# Patient Record
Sex: Female | Born: 1965 | Race: White | Hispanic: No | Marital: Married | Smoking: Former smoker
Health system: Southern US, Community
[De-identification: ages and names within clinical notes are randomized; demographics above are authoritative.]

## PROBLEM LIST (undated history)

## (undated) DIAGNOSIS — G4700 Insomnia, unspecified: Secondary | ICD-10-CM

## (undated) DIAGNOSIS — F909 Attention-deficit hyperactivity disorder, unspecified type: Secondary | ICD-10-CM

## (undated) DIAGNOSIS — G43909 Migraine, unspecified, not intractable, without status migrainosus: Secondary | ICD-10-CM

## (undated) DIAGNOSIS — F32A Depression, unspecified: Secondary | ICD-10-CM

## (undated) HISTORY — DX: Depression, unspecified: F32.A

## (undated) HISTORY — DX: Migraine, unspecified, not intractable, without status migrainosus: G43.909

## (undated) HISTORY — PX: COLONOSCOPY W/ POLYPECTOMY: SHX1380

## (undated) HISTORY — PX: WISDOM TOOTH EXTRACTION: SHX21

## (undated) HISTORY — DX: Attention-deficit hyperactivity disorder, unspecified type: F90.9

## (undated) HISTORY — DX: Insomnia, unspecified: G47.00

---

## 2004-10-28 ENCOUNTER — Ambulatory Visit: Payer: Self-pay | Admitting: Obstetrics and Gynecology

## 2005-06-07 ENCOUNTER — Ambulatory Visit: Payer: Self-pay | Admitting: Obstetrics and Gynecology

## 2006-07-26 ENCOUNTER — Ambulatory Visit: Payer: Self-pay | Admitting: Obstetrics and Gynecology

## 2006-09-05 ENCOUNTER — Ambulatory Visit: Payer: Self-pay | Admitting: Otolaryngology

## 2007-08-01 ENCOUNTER — Ambulatory Visit: Payer: Self-pay | Admitting: Obstetrics and Gynecology

## 2008-08-12 ENCOUNTER — Ambulatory Visit: Payer: Self-pay | Admitting: Obstetrics and Gynecology

## 2009-08-17 ENCOUNTER — Ambulatory Visit: Payer: Self-pay | Admitting: Obstetrics and Gynecology

## 2009-11-03 ENCOUNTER — Ambulatory Visit: Payer: Self-pay | Admitting: Gastroenterology

## 2010-08-24 ENCOUNTER — Ambulatory Visit: Payer: Self-pay | Admitting: Obstetrics and Gynecology

## 2011-09-13 ENCOUNTER — Ambulatory Visit: Payer: Self-pay | Admitting: Obstetrics and Gynecology

## 2012-09-18 ENCOUNTER — Ambulatory Visit: Payer: Self-pay | Admitting: Obstetrics and Gynecology

## 2013-09-19 ENCOUNTER — Ambulatory Visit: Payer: Self-pay | Admitting: Obstetrics and Gynecology

## 2014-11-11 ENCOUNTER — Other Ambulatory Visit: Payer: Self-pay | Admitting: Obstetrics and Gynecology

## 2014-11-11 DIAGNOSIS — Z1231 Encounter for screening mammogram for malignant neoplasm of breast: Secondary | ICD-10-CM

## 2014-11-19 ENCOUNTER — Ambulatory Visit: Payer: Self-pay | Attending: Obstetrics and Gynecology

## 2016-06-07 ENCOUNTER — Other Ambulatory Visit: Payer: Self-pay | Admitting: Obstetrics and Gynecology

## 2016-06-07 DIAGNOSIS — Z1231 Encounter for screening mammogram for malignant neoplasm of breast: Secondary | ICD-10-CM

## 2016-07-13 ENCOUNTER — Encounter (HOSPITAL_COMMUNITY): Payer: Self-pay

## 2016-07-13 ENCOUNTER — Ambulatory Visit
Admission: RE | Admit: 2016-07-13 | Discharge: 2016-07-13 | Disposition: A | Discharge status: Home / Self Care | Payer: 59 | Source: Ambulatory Visit | Attending: Obstetrics and Gynecology | Admitting: Obstetrics and Gynecology

## 2016-07-13 DIAGNOSIS — Z1231 Encounter for screening mammogram for malignant neoplasm of breast: Secondary | ICD-10-CM | POA: Diagnosis not present

## 2017-06-14 ENCOUNTER — Other Ambulatory Visit: Payer: Self-pay | Admitting: Obstetrics and Gynecology

## 2017-06-14 DIAGNOSIS — Z1231 Encounter for screening mammogram for malignant neoplasm of breast: Secondary | ICD-10-CM

## 2017-07-19 ENCOUNTER — Other Ambulatory Visit: Payer: Self-pay | Admitting: Obstetrics and Gynecology

## 2017-07-19 ENCOUNTER — Ambulatory Visit
Admission: RE | Admit: 2017-07-19 | Discharge: 2017-07-19 | Disposition: A | Discharge status: Home / Self Care | Payer: Managed Care, Other (non HMO) | Source: Ambulatory Visit | Attending: Obstetrics and Gynecology | Admitting: Obstetrics and Gynecology

## 2017-07-19 DIAGNOSIS — Z1231 Encounter for screening mammogram for malignant neoplasm of breast: Secondary | ICD-10-CM

## 2017-12-13 ENCOUNTER — Other Ambulatory Visit: Payer: Self-pay

## 2018-03-28 ENCOUNTER — Encounter: Payer: Self-pay | Admitting: Psychiatry

## 2018-03-28 ENCOUNTER — Ambulatory Visit: Payer: BLUE CROSS/BLUE SHIELD | Admitting: Psychiatry

## 2018-03-28 VITALS — BP 123/85 | HR 98

## 2018-03-28 DIAGNOSIS — F900 Attention-deficit hyperactivity disorder, predominantly inattentive type: Secondary | ICD-10-CM | POA: Diagnosis not present

## 2018-03-28 DIAGNOSIS — F3289 Other specified depressive episodes: Secondary | ICD-10-CM

## 2018-03-28 DIAGNOSIS — F5105 Insomnia due to other mental disorder: Secondary | ICD-10-CM | POA: Diagnosis not present

## 2018-03-28 MED ORDER — LISDEXAMFETAMINE DIMESYLATE 70 MG PO CAPS: 70.0000 mg | ORAL_CAPSULE | ORAL | 0 refills | Status: DC

## 2018-03-28 MED ORDER — ZOLPIDEM TARTRATE 10 MG PO TABS: 10.0000 mg | ORAL_TABLET | Freq: Every day | ORAL | 1 refills | Status: DC

## 2018-03-28 MED ORDER — BUPROPION HCL ER (XL) 300 MG PO TB24: 300.0000 mg | ORAL_TABLET | Freq: Every morning | ORAL | 1 refills | Status: DC

## 2018-03-28 NOTE — Progress Notes (Signed)
ROSSIE Turner [id number] October 22, 1965 53 y.o.  Subjective:   Patient ID:  Joy Turner is a 53 y.o. (DOB [birth year]) female.  Chief Complaint:  Chief Complaint  Patient presents with  . Follow-up    Medication Management    HPI Jeriyah L Heppler presents to the office today for follow-up of atypical depression and ADD.  Good, the same as last visit.  Patient reports stable mood and denies depressed or irritable moods.  Patient denies any recent difficulty with anxiety.  Patient denies difficulty with sleep initiation or maintenance. 6-7 hours with Ambien.  Denies appetite disturbance.  Patient reports that energy and motivation have been good.  Patient denies any difficulty with concentration.  Patient denies any suicidal ideation.  Focus still good with Vyvanse.  Pleased. Productive.  Work is OK from home.  Past Psychiatric Medication Trials: Vyvanse 70, Adderall, Abilify, Prozac worsened depression, Imitrex, Ambien, Relpax   Review of Systems:  Review of Systems  Neurological: Positive for headaches. Negative for tremors and weakness.  Psychiatric/Behavioral: Negative for agitation, behavioral problems, confusion, decreased concentration, dysphoric mood, hallucinations, self-injury, sleep disturbance and suicidal ideas. The patient is not nervous/anxious and is not hyperactive.     Medications: I have reviewed the patient's current medications.  Current Outpatient Medications  Medication Sig Dispense Refill  . buPROPion (WELLBUTRIN XL) 300 MG 24 hr tablet Take 1 tablet (300 mg total) by mouth every morning. 90 tablet 1  . eletriptan (RELPAX) 40 MG tablet Take 40 mg by mouth as needed for migraine or headache. May repeat in 2 hours if headache persists or recurs.    Marland Kitchen etonogestrel-ethinyl estradiol (NUVARING) 0.12-0.015 MG/24HR vaginal ring     . lisdexamfetamine (VYVANSE) 70 MG capsule Take 1 capsule (70 mg total) by mouth every morning. 90 capsule 0  . SUMAtriptan (IMITREX)  100 MG tablet Take 100 mg by mouth. May repeat in 2 hours if headache persists or recurs.    Marland Kitchen zolpidem (AMBIEN) 10 MG tablet Take 1 tablet (10 mg total) by mouth at bedtime. 90 tablet 1   No current facility-administered medications for this visit.     Medication Side Effects: None except with migraine meds.  Allergies:  Allergies  Allergen Reactions  . Penicillins Rash    rash    History reviewed. No pertinent past medical history.  Family History  Problem Relation Age of Onset  . Breast cancer Paternal Aunt     Social History   Socioeconomic History  . Marital status: Married    Spouse name: Not on file  . Number of children: Not on file  . Years of education: Not on file  . Highest education level: Not on file  Occupational History  . Not on file  Social Needs  . Financial resource strain: Not on file  . Food insecurity:    Worry: Not on file    Inability: Not on file  . Transportation needs:    Medical: Not on file    Non-medical: Not on file  Tobacco Use  . Smoking status: Never Smoker  . Smokeless tobacco: Never Used  Substance and Sexual Activity  . Alcohol use: Not Currently  . Drug use: Never  . Sexual activity: Not on file  Lifestyle  . Physical activity:    Days per week: Not on file    Minutes per session: Not on file  . Stress: Not on file  Relationships  . Social connections:    Talks on phone: Not on  file    Gets together: Not on file    Attends religious service: Not on file    Active member of club or organization: Not on file    Attends meetings of clubs or organizations: Not on file    Relationship status: Not on file  . Intimate partner violence:    Fear of current or ex partner: Not on file    Emotionally abused: Not on file    Physically abused: Not on file    Forced sexual activity: Not on file  Other Topics Concern  . Not on file  Social History Narrative  . Not on file    Past Medical History, Surgical history, Social  history, and Family history were reviewed and updated as appropriate.   Please see review of systems for further details on the patient's review from today.   Objective:   Physical Exam:  BP 123/85 (BP Location: Left Arm)   Pulse 98   Physical Exam Constitutional:      General: She is not in acute distress.    Appearance: She is well-developed.  Musculoskeletal:        General: No deformity.  Neurological:     Mental Status: She is alert and oriented to person, place, and time.     Coordination: Coordination normal.  Psychiatric:        Attention and Perception: Attention normal. She is attentive.        Mood and Affect: Mood normal. Mood is not anxious or depressed. Affect is not labile, blunt, angry or inappropriate.        Speech: Speech normal.        Behavior: Behavior normal.        Thought Content: Thought content normal. Thought content does not include homicidal or suicidal ideation. Thought content does not include homicidal or suicidal plan.        Cognition and Memory: Cognition normal.        Judgment: Judgment normal.     Comments: Insight is good.     Lab Review:  No results found for: NA, K, CL, CO2, GLUCOSE, BUN, CREATININE, CALCIUM, PROT, ALBUMIN, AST, ALT, ALKPHOS, BILITOT, GFRNONAA, GFRAA  No results found for: WBC, RBC, HGB, HCT, PLT, MCV, MCH, MCHC, RDW, LYMPHSABS, MONOABS, EOSABS, BASOSABS  No results found for: POCLITH, LITHIUM   No results found for: PHENYTOIN, PHENOBARB, VALPROATE, CBMZ   .res Assessment: Plan:    Attention deficit hyperactivity disorder (ADHD), predominantly inattentive type - Plan: lisdexamfetamine (VYVANSE) 70 MG capsule  Atypical depression  Insomnia due to mental condition   Good response to medications.  She has been stable for some times.  No med changes today.  Was a cautioned about the risk of Ambien causing amnesia peer.  She is aware and is used using the lowest effective dose.  Discussed potential benefits,  risks, and side effects of stimulants with patient to include increased heart rate, palpitations, insomnia, increased anxiety, increased irritability, or decreased appetite.  Instructed patient to contact office if experiencing any significant tolerability issues.  Follow-up 6 months  Iona Hansen, MD, DFAPA  Please see After Visit Summary for patient specific instructions.  Future Appointments  Date Time Provider Department Center  09/26/2018 11:30 AM Cottle, Steva Ready., MD [facility] None    No orders of the defined types were placed in this encounter.     -------------------------------

## 2018-06-22 ENCOUNTER — Other Ambulatory Visit: Payer: Self-pay | Admitting: Obstetrics and Gynecology

## 2018-06-22 DIAGNOSIS — Z1231 Encounter for screening mammogram for malignant neoplasm of breast: Secondary | ICD-10-CM

## 2018-07-09 ENCOUNTER — Other Ambulatory Visit: Payer: Self-pay

## 2018-07-09 DIAGNOSIS — F900 Attention-deficit hyperactivity disorder, predominantly inattentive type: Secondary | ICD-10-CM

## 2018-07-09 MED ORDER — LISDEXAMFETAMINE DIMESYLATE 70 MG PO CAPS: 70.0000 mg | ORAL_CAPSULE | ORAL | 0 refills | Status: DC

## 2018-09-26 ENCOUNTER — Other Ambulatory Visit: Payer: Self-pay

## 2018-09-26 ENCOUNTER — Ambulatory Visit (INDEPENDENT_AMBULATORY_CARE_PROVIDER_SITE_OTHER): Payer: BC Managed Care – PPO | Admitting: Psychiatry

## 2018-09-26 ENCOUNTER — Encounter: Payer: Self-pay | Admitting: Psychiatry

## 2018-09-26 DIAGNOSIS — F3289 Other specified depressive episodes: Secondary | ICD-10-CM | POA: Diagnosis not present

## 2018-09-26 DIAGNOSIS — F5105 Insomnia due to other mental disorder: Secondary | ICD-10-CM | POA: Diagnosis not present

## 2018-09-26 DIAGNOSIS — G43009 Migraine without aura, not intractable, without status migrainosus: Secondary | ICD-10-CM

## 2018-09-26 DIAGNOSIS — F900 Attention-deficit hyperactivity disorder, predominantly inattentive type: Secondary | ICD-10-CM | POA: Diagnosis not present

## 2018-09-26 MED ORDER — LISDEXAMFETAMINE DIMESYLATE 70 MG PO CAPS: 70.0000 mg | ORAL_CAPSULE | ORAL | 0 refills | Status: DC

## 2018-09-26 NOTE — Progress Notes (Signed)
Joy Turner [id number] 29-Nov-1965 53 y.o.  Virtual Visit via Telephone Note  I connected with pt by telephone and verified that I am speaking with the correct person using two identifiers.   I discussed the limitations, risks, security and privacy concerns of performing an evaluation and management service by telephone and the availability of in person appointments. I also discussed with the patient that there may be a patient responsible charge related to this service. The patient expressed understanding and agreed to proceed.  I discussed the assessment and treatment plan with the patient. The patient was provided an opportunity to ask questions and all were answered. The patient agreed with the plan and demonstrated an understanding of the instructions.   The patient was advised to call back or seek an in-person evaluation if the symptoms worsen or if the condition fails to improve as anticipated.  I provided 15 minutes of non-face-to-face time during this encounter. The call started at 1130 and ended at 1145. The patient was located at home and the provider was located office.  Subjective:   Patient ID:  Joy Turner is a 53 y.o. (DOB 08-31-1965) female.  Chief Complaint:  Chief Complaint  Patient presents with  . Follow-up     Medication Management  . ADHD     Medication Management    HPI Joy Turner presents to the office today for follow-up of atypical depression and ADD.  Last seen March 28, 2018.  No meds were changed.  She has been stable on the same medication for quite some period of time.  She's remained well.  Not much change bc work from home anyway.  H now works from home too for [employer].  Doing well with the meds and no new concerns.  Focus still good with Vyvanse.  Pleased. Productive.  Work is OK from home.  Not using migraine meds bc nausea.  Usually last less than a day.  A few times monthly.  Unable to show dogs like she enjoys.  Good, the  same as last visit.  Patient reports stable mood and denies depressed or irritable moods.  Patient denies any recent difficulty with anxiety.  Patient denies difficulty with sleep initiation or maintenance. 6-7 hours with Ambien.  Denies appetite disturbance.  Patient reports that energy and motivation have been good.  Patient denies any difficulty with concentration.  Patient denies any suicidal ideation.  Past Psychiatric Medication Trials: Vyvanse 70, Adderall, Abilify, Prozac worsened depression, Imitrex, Ambien, Relpax, topiramate  Review of Systems:  Review of Systems  Neurological: Positive for headaches. Negative for tremors and weakness.  Psychiatric/Behavioral: Negative for agitation, behavioral problems, confusion, decreased concentration, dysphoric mood, hallucinations, self-injury, sleep disturbance and suicidal ideas. The patient is not nervous/anxious and is not hyperactive.     Medications: I have reviewed the patient's current medications.  Current Outpatient Medications  Medication Sig Dispense Refill  . buPROPion (WELLBUTRIN XL) 300 MG 24 hr tablet Take 1 tablet (300 mg total) by mouth every morning. 90 tablet 1  . etonogestrel-ethinyl estradiol (NUVARING) 0.12-0.015 MG/24HR vaginal ring     . lisdexamfetamine (VYVANSE) 70 MG capsule Take 1 capsule (70 mg total) by mouth every morning. 90 capsule 0  . zolpidem (AMBIEN) 10 MG tablet Take 1 tablet (10 mg total) by mouth at bedtime. 90 tablet 1   No current facility-administered medications for this visit.     Medication Side Effects: None except with migraine meds.  Allergies:  Allergies  Allergen Reactions  .  Penicillins Rash    rash    History reviewed. No pertinent past medical history.  Family History  Problem Relation Age of Onset  . Breast cancer Paternal Aunt     Social History   Socioeconomic History  . Marital status: Married    Spouse name: Not on file  . Number of children: Not on file  . Years  of education: Not on file  . Highest education level: Not on file  Occupational History  . Not on file  Social Needs  . Financial resource strain: Not on file  . Food insecurity    Worry: Not on file    Inability: Not on file  . Transportation needs    Medical: Not on file    Non-medical: Not on file  Tobacco Use  . Smoking status: Never Smoker  . Smokeless tobacco: Never Used  Substance and Sexual Activity  . Alcohol use: Not Currently  . Drug use: Never  . Sexual activity: Not on file  Lifestyle  . Physical activity    Days per week: Not on file    Minutes per session: Not on file  . Stress: Not on file  Relationships  . Social Musicianconnections    Talks on phone: Not on file    Gets together: Not on file    Attends religious service: Not on file    Active member of club or organization: Not on file    Attends meetings of clubs or organizations: Not on file    Relationship status: Not on file  . Intimate partner violence    Fear of current or ex partner: Not on file    Emotionally abused: Not on file    Physically abused: Not on file    Forced sexual activity: Not on file  Other Topics Concern  . Not on file  Social History Narrative  . Not on file    Past Medical History, Surgical history, Social history, and Family history were reviewed and updated as appropriate.   Please see review of systems for further details on the patient's review from today.   Objective:   Physical Exam:  There were no vitals taken for this visit.  Physical Exam Constitutional:      General: She is not in acute distress.    Appearance: She is well-developed.  Musculoskeletal:        General: No deformity.  Neurological:     Mental Status: She is alert and oriented to person, place, and time.     Coordination: Coordination normal.  Psychiatric:        Attention and Perception: Attention normal. She is attentive.        Mood and Affect: Mood normal. Mood is not anxious or depressed.  Affect is not labile, blunt, angry or inappropriate.        Speech: Speech normal.        Behavior: Behavior normal.        Thought Content: Thought content normal. Thought content does not include homicidal or suicidal ideation. Thought content does not include homicidal or suicidal plan.        Cognition and Memory: Cognition normal.        Judgment: Judgment normal.     Comments: Insight is good.     Lab Review:  No results found for: NA, K, CL, CO2, GLUCOSE, BUN, CREATININE, CALCIUM, PROT, ALBUMIN, AST, ALT, ALKPHOS, BILITOT, GFRNONAA, GFRAA  No results found for: WBC, RBC, HGB, HCT, PLT, MCV, MCH,  MCHC, RDW, LYMPHSABS, MONOABS, EOSABS, BASOSABS  No results found for: POCLITH, LITHIUM   No results found for: PHENYTOIN, PHENOBARB, VALPROATE, CBMZ   .res Assessment: Plan:    Attention deficit hyperactivity disorder (ADHD), predominantly inattentive type  Atypical depression  Insomnia due to mental condition  Migraine without aura and without status migrainosus, not intractable   Good response to medications.  She has been stable for some times.  Depression and ADD are under control with the medications.  She does not want medicine changes.  Migraines are manageable somewhat but consider alternative triptan. Or use Zofran for the nausea to allow tolerance of triptan.   Educate about preventative meds.  Ajovy, Aimovig  No med changes today.  Was a cautioned about the risk of Ambien causing amnesia.  She is aware and is used using the lowest effective dose.  Discussed potential benefits, risks, and side effects of stimulants with patient to include increased heart rate, palpitations, insomnia, increased anxiety, increased irritability, or decreased appetite.  Instructed patient to contact office if experiencing any significant tolerability issues.  Follow-up 6 months  Meredith Staggers, MD, DFAPA  Please see After Visit Summary for patient specific instructions.  No future  appointments.  No orders of the defined types were placed in this encounter.     -------------------------------

## 2018-10-04 ENCOUNTER — Telehealth: Payer: Self-pay

## 2018-10-04 NOTE — Telephone Encounter (Signed)
Prior authorization submitted and approved for YVANSE CAP 70MG , 1 daily effective 10/04/2018 through 10/04/2019. With [facility]. Case number: [id number]

## 2018-10-17 ENCOUNTER — Other Ambulatory Visit: Payer: Self-pay | Admitting: Psychiatry

## 2018-10-18 NOTE — Telephone Encounter (Signed)
Last appt 08/26 Last refill 07/27

## 2018-10-25 NOTE — Telephone Encounter (Signed)
Last 90 day refill 08/27/2018

## 2018-11-20 ENCOUNTER — Other Ambulatory Visit: Payer: Self-pay | Admitting: Psychiatry

## 2018-11-20 NOTE — Telephone Encounter (Signed)
Submitted 09/24 for 90 day

## 2019-01-08 ENCOUNTER — Telehealth: Payer: Self-pay | Admitting: Psychiatry

## 2019-01-08 ENCOUNTER — Other Ambulatory Visit: Payer: Self-pay

## 2019-01-08 DIAGNOSIS — F900 Attention-deficit hyperactivity disorder, predominantly inattentive type: Secondary | ICD-10-CM

## 2019-01-08 MED ORDER — LISDEXAMFETAMINE DIMESYLATE 70 MG PO CAPS: 70.0000 mg | ORAL_CAPSULE | ORAL | 0 refills | Status: DC

## 2019-01-08 NOTE — Telephone Encounter (Signed)
Last refill 10/10/2018 Pended for approval

## 2019-01-08 NOTE — Telephone Encounter (Signed)
Pt would like a refill Vyvanse 70mg  sent in at [facility].

## 2019-02-02 ENCOUNTER — Other Ambulatory Visit: Payer: Self-pay | Admitting: Psychiatry

## 2019-02-12 ENCOUNTER — Telehealth: Payer: Self-pay | Admitting: Psychiatry

## 2019-02-12 NOTE — Telephone Encounter (Signed)
Joy Turner called to request refill of her Ambien.  She needs a 30 day supply sent local to [facility] on [address] - [place].  Then needs a 90 day supply to [facility].  She did say that [facility] told her that this medication is no longer covered by the insurance and will probably need a PA.  She has 8 pills left so hopefully we'll get the PA done before she runs out.  Next appt is 03/27/19

## 2019-02-13 ENCOUNTER — Other Ambulatory Visit: Payer: Self-pay

## 2019-02-13 MED ORDER — ZOLPIDEM TARTRATE 10 MG PO TABS: 10.0000 mg | ORAL_TABLET | Freq: Every evening | ORAL | 0 refills | Status: DC | PRN

## 2019-02-13 MED ORDER — ZOLPIDEM TARTRATE 10 MG PO TABS: 10.0000 mg | ORAL_TABLET | Freq: Every day | ORAL | 0 refills | Status: DC

## 2019-02-13 NOTE — Telephone Encounter (Signed)
FYI, I'm not sure this worked to send RX to 2 different pharmacies at once. I've not tried it before

## 2019-02-13 NOTE — Telephone Encounter (Signed)
Last refill Zolpidem 10 mg #90 on 11/14/2018 Pended for Dr. Jennelle Human to submit to [facility] for a local supply to be sent to [facility]   Tried to submit a PA for Zolpidem but it says its a covered medication on her plan.

## 2019-03-27 ENCOUNTER — Ambulatory Visit (INDEPENDENT_AMBULATORY_CARE_PROVIDER_SITE_OTHER): Payer: BC Managed Care – PPO | Admitting: Psychiatry

## 2019-03-27 ENCOUNTER — Encounter: Payer: Self-pay | Admitting: Psychiatry

## 2019-03-27 DIAGNOSIS — F3289 Other specified depressive episodes: Secondary | ICD-10-CM

## 2019-03-27 DIAGNOSIS — F5105 Insomnia due to other mental disorder: Secondary | ICD-10-CM

## 2019-03-27 DIAGNOSIS — F900 Attention-deficit hyperactivity disorder, predominantly inattentive type: Secondary | ICD-10-CM

## 2019-03-27 DIAGNOSIS — G43009 Migraine without aura, not intractable, without status migrainosus: Secondary | ICD-10-CM | POA: Diagnosis not present

## 2019-03-27 MED ORDER — LISDEXAMFETAMINE DIMESYLATE 70 MG PO CAPS: 70.0000 mg | ORAL_CAPSULE | ORAL | 0 refills | Status: DC

## 2019-03-27 MED ORDER — ZOLPIDEM TARTRATE 10 MG PO TABS: 10.0000 mg | ORAL_TABLET | Freq: Every day | ORAL | 1 refills | Status: DC

## 2019-03-27 MED ORDER — BUPROPION HCL ER (XL) 300 MG PO TB24: 300.0000 mg | ORAL_TABLET | Freq: Every morning | ORAL | 3 refills | Status: DC

## 2019-03-27 NOTE — Progress Notes (Signed)
Joy Turner [id number] 16-Oct-1965 54 y.o.  Virtual Visit via [place]  I connected with pt by WebEx and verified that I am speaking with the correct person using two identifiers.   I discussed the limitations, risks, security and privacy concerns of performing an evaluation and management service by Jackqul[facility] [place] and the availability of in person appointments. I also discussed with the patient that there may be a patient responsible charge related to this service. The patient expressed understanding and agreed to proceed.  I discussed the assessment and treatment plan with the patient. The patient was provided an opportunity to ask questions and all were answered. The patient agreed with the plan and demonstrated an understanding of the instructions.   The patient was advised to call back or seek an in-person evaluation if the symptoms worsen or if the condition fails to improve as anticipated.  I provided 30 minutes of video time during this encounter. The call started at 130 and ended at 2:00. The patient was located at home and the provider was located office.   Subjective:   Patient ID:  Joy Turner is a 55 y.o. (DOB September 05, 1965) female.  Chief Complaint:  Chief Complaint  Patient presents with  . Follow-up    depression and ADD    HPI Joy Turner presents to the office today for follow-up of atypical depression and ADD.  Last seen August, 2020.  No meds were changed.  She has been stable on the same medication for quite some period of time.   She's remained well.  Not much change bc work from home anyway.  H now works from home too for [employer].  Doing well with the meds and no new concerns.  Focus still good with Vyvanse.  Pleased. Productive.  Work is OK from home.   Went off Wellbutrin for 4 weeks DT late in the mail and decided to stop it.  Got more depressed and irritable without it.    Not using migraine meds bc nausea.  Usually last less than a day.  A few times monthly.  Takes OTC.  Not as bad as in the past.  Some tension HA.  Disc Mackenzie exercises.  Unable to show dogs like she enjoys.  Good, the same as last visit.  Patient reports stable mood and denies depressed or irritable moods.  Patient denies any recent difficulty with anxiety.  Patient denies difficulty with sleep initiation or maintenance. 6-7 hours with Ambien.  Denies appetite disturbance.  Patient reports that energy and motivation have been good.  Patient denies any difficulty with concentration.  Patient denies any suicidal ideation.  Past Psychiatric Medication Trials: Vyvanse 70, Adderall,  Wellbutrin 300 helped, Abilify, Prozac worsened depression,  Imitrex, Relpax, topiramate  Ambien,  Review of Systems:  Review of Systems  Cardiovascular: Negative for palpitations.  Neurological: Positive for headaches. Negative for tremors and weakness.  Psychiatric/Behavioral: Negative for agitation, behavioral problems, confusion, decreased concentration, dysphoric mood, hallucinations, self-injury, sleep disturbance and suicidal ideas. The patient is not nervous/anxious and is not hyperactive.     Medications: I have reviewed the patient's current medications.  Current Outpatient Medications  Medication Sig Dispense Refill  . buPROPion (WELLBUTRIN XL) 300 MG 24 hr tablet Take 1 tablet (300 mg total) by mouth every morning. 90 tablet 3  . etonogestrel-ethinyl estradiol (NUVARING) 0.12-0.015 MG/24HR vaginal ring     . lisdexamfetamine (VYVANSE) 70 MG capsule Take 1 capsule (70 mg total) by mouth every morning. 90 capsule 0  .  zolpidem (AMBIEN) 10 MG tablet Take 1 tablet (10 mg total) by mouth at bedtime. 90 tablet 1   No current facility-administered medications for this visit.    Medication Side Effects: None except with migraine meds.  Allergies:  Allergies  Allergen Reactions  . Penicillins Rash    rash    History reviewed. No pertinent past medical history.  Family History   Problem Relation Age of Onset  . Breast cancer Paternal Aunt     Social History   Socioeconomic History  . Marital status: Married    Spouse name: Not on file  . Number of children: Not on file  . Years of education: Not on file  . Highest education level: Not on file  Occupational History  . Not on file  Tobacco Use  . Smoking status: Never Smoker  . Smokeless tobacco: Never Used  Substance and Sexual Activity  . Alcohol use: Not Currently  . Drug use: Never  . Sexual activity: Not on file  Other Topics Concern  . Not on file  Social History Narrative  . Not on file   Social Determinants of Health   Financial Resource Strain:   . Difficulty of Paying Living Expenses: Not on file  Food Insecurity:   . Worried About Programme researcher, broadcasting/film/video in the Last Year: Not on file  . Ran Out of Food in the Last Year: Not on file  Transportation Needs:   . Lack of Transportation (Medical): Not on file  . Lack of Transportation (Non-Medical): Not on file  Physical Activity:   . Days of Exercise per Week: Not on file  . Minutes of Exercise per Session: Not on file  Stress:   . Feeling of Stress : Not on file  Social Connections:   . Frequency of Communication with Friends and Family: Not on file  . Frequency of Social Gatherings with Friends and Family: Not on file  . Attends Religious Services: Not on file  . Active Member of Clubs or Organizations: Not on file  . Attends Banker Meetings: Not on file  . Marital Status: Not on file  Intimate Partner Violence:   . Fear of Current or Ex-Partner: Not on file  . Emotionally Abused: Not on file  . Physically Abused: Not on file  . Sexually Abused: Not on file    Past Medical History, Surgical history, Social history, and Family history were reviewed and updated as appropriate.   Please see review of systems for further details on the patient's review from today.   Objective:   Physical Exam:  There were no vitals  taken for this visit.  Physical Exam Constitutional:      General: She is not in acute distress.    Appearance: She is well-developed.  Musculoskeletal:        General: No deformity.  Neurological:     Mental Status: She is alert and oriented to person, place, and time.     Coordination: Coordination normal.  Psychiatric:        Attention and Perception: Attention normal. She is attentive.        Mood and Affect: Mood normal. Mood is not anxious or depressed. Affect is not labile, blunt, angry or inappropriate.        Speech: Speech normal. Speech is not rapid and pressured.        Behavior: Behavior normal.        Thought Content: Thought content normal. Thought content does not  include homicidal or suicidal ideation. Thought content does not include homicidal or suicidal plan.        Cognition and Memory: Cognition normal.        Judgment: Judgment normal.     Comments: Insight is good. Irritable gone.     Lab Review:  No results found for: NA, K, CL, CO2, GLUCOSE, BUN, CREATININE, CALCIUM, PROT, ALBUMIN, AST, ALT, ALKPHOS, BILITOT, GFRNONAA, GFRAA  No results found for: WBC, RBC, HGB, HCT, PLT, MCV, MCH, MCHC, RDW, LYMPHSABS, MONOABS, EOSABS, BASOSABS  No results found for: POCLITH, LITHIUM   No results found for: PHENYTOIN, PHENOBARB, VALPROATE, CBMZ   .res Assessment: Plan:    Atypical depression - Plan: buPROPion (WELLBUTRIN XL) 300 MG 24 hr tablet  Attention deficit hyperactivity disorder (ADHD), predominantly inattentive type - Plan: lisdexamfetamine (VYVANSE) 70 MG capsule  Migraine without aura and without status migrainosus, not intractable  Insomnia due to mental condition - Plan: zolpidem (AMBIEN) 10 MG tablet   Good response to medications.  She has been stable for some times.  Depression and ADD are under control with the medications.  She does not want medicine changes. Got more irritable without Wellbutrin so restarted it. Disc time frame of response to  antidepressants.  Migraines are manageable somewhat but consider alternative triptan. Or use Zofran for the nausea to allow tolerance of triptan.  Relpax worked better with less SE vs Imitrex. Using Execedrin and follows instructions.   Educate about preventative meds.  Ajovy, Aimovig  No med changes today.  Was a cautioned about the risk of Ambien causing amnesia.  She is aware and is used using the lowest effective dose.  Ambien helped.    Discussed potential benefits, risks, and side effects of stimulants with patient to include increased heart rate, palpitations, insomnia, increased anxiety, increased irritability, or decreased appetite.  Instructed patient to contact office if experiencing any significant tolerability issues.  Follow-up 6 months  Meredith Staggers, MD, DFAPA  Please see After Visit Summary for patient specific instructions.  No future appointments.  No orders of the defined types were placed in this encounter.     -------------------------------

## 2019-07-09 ENCOUNTER — Telehealth: Payer: Self-pay | Admitting: Psychiatry

## 2019-07-09 ENCOUNTER — Other Ambulatory Visit: Payer: Self-pay

## 2019-07-09 DIAGNOSIS — F900 Attention-deficit hyperactivity disorder, predominantly inattentive type: Secondary | ICD-10-CM

## 2019-07-09 MED ORDER — LISDEXAMFETAMINE DIMESYLATE 70 MG PO CAPS: 70.0000 mg | ORAL_CAPSULE | ORAL | 0 refills | Status: DC

## 2019-07-09 NOTE — Telephone Encounter (Signed)
Pt would like a refill on Vyvanse 70mg  sent in at [facility].

## 2019-07-09 NOTE — Telephone Encounter (Signed)
Last refill 04/01/2019 90 day Pended for Dr. Jennelle Human to review and submit Due back in Sept 2021

## 2019-10-11 ENCOUNTER — Telehealth: Payer: Self-pay | Admitting: Psychiatry

## 2019-10-11 NOTE — Telephone Encounter (Signed)
Error

## 2019-10-13 ENCOUNTER — Other Ambulatory Visit: Payer: Self-pay | Admitting: Psychiatry

## 2019-10-13 DIAGNOSIS — F5105 Insomnia due to other mental disorder: Secondary | ICD-10-CM

## 2019-10-14 NOTE — Telephone Encounter (Signed)
Apt tomorrow 09/14

## 2019-10-15 ENCOUNTER — Telehealth (INDEPENDENT_AMBULATORY_CARE_PROVIDER_SITE_OTHER): Payer: BC Managed Care – PPO | Admitting: Psychiatry

## 2019-10-15 ENCOUNTER — Encounter: Payer: Self-pay | Admitting: Psychiatry

## 2019-10-15 DIAGNOSIS — F3289 Other specified depressive episodes: Secondary | ICD-10-CM | POA: Diagnosis not present

## 2019-10-15 DIAGNOSIS — F5105 Insomnia due to other mental disorder: Secondary | ICD-10-CM

## 2019-10-15 DIAGNOSIS — F900 Attention-deficit hyperactivity disorder, predominantly inattentive type: Secondary | ICD-10-CM

## 2019-10-15 DIAGNOSIS — G43009 Migraine without aura, not intractable, without status migrainosus: Secondary | ICD-10-CM | POA: Diagnosis not present

## 2019-10-15 MED ORDER — LISDEXAMFETAMINE DIMESYLATE 70 MG PO CAPS: 70.0000 mg | ORAL_CAPSULE | ORAL | 0 refills | Status: DC

## 2019-10-15 MED ORDER — LISDEXAMFETAMINE DIMESYLATE 70 MG PO CAPS: 70.0000 mg | ORAL_CAPSULE | Freq: Every day | ORAL | 0 refills | Status: DC

## 2019-10-15 MED ORDER — ZOLPIDEM TARTRATE 10 MG PO TABS: 10.0000 mg | ORAL_TABLET | Freq: Every day | ORAL | 1 refills | Status: DC

## 2019-10-15 NOTE — Progress Notes (Signed)
[name] [id number] Jan 16, 1966 54 y.o.  Virtual Visit via [facility]  I connected with pt by WebEx and verified that I am speaking with the correct person using two identifiers.   I discussed the limitations, risks, security and privacy concerns of performing an evaluation and management service by [place] [place] and the availability of in person appointments. I also discussed with the patient that there may be a patient responsible charge related to this service. The patient expressed understanding and agreed to proceed.  I discussed the assessment and treatment plan with the patient. The patient was provided an opportunity to ask questions and all were answered. The patient agreed with the plan and demonstrated an understanding of the instructions.   The patient was advised to call back or seek an in-person evaluation if the symptoms worsen or if the condition fails to improve as anticipated.  I provided 30 minutes of video time during this encounter. The call started at 130 and ended at 2:00. The patient was located at home and the provider was located office.   Subjective:   Patient ID:  Joy Turner is a 54 y.o. (DOB 06/28/1965) female.  Chief Complaint:  Chief Complaint  Patient presents with  . Follow-up    Medication Management  . Depression    Medication Management  . Medication Refill    Vyvanse  . ADHD    Depression        Associated symptoms include headaches.  Associated symptoms include no decreased concentration and no suicidal ideas. Medication Refill Associated symptoms include headaches. Pertinent negatives include no chest pain or weakness.   Joy Turner presents to the office today for follow-up of atypical depression and ADD.  seen August, 2020.  No meds were changed.  She has been stable on the same medication for quite some period of time.   She's remained well.  Not much change bc work from home anyway.  H now works from home too for [employer].  Doing  well with the meds and no new concerns.  Focus still good with Vyvanse.  Pleased. Productive.  Work is OK from home.   Went off Wellbutrin for 4 weeks DT late in the mail and decided to stop it.  Got more depressed and irritable without it.  Stayed with work from home and away from [employer].   Not using migraine meds bc nausea and cant get Relpax.  They are milder now..  Usually last less than a day.  A few times monthly. Takes OTC.  Not as bad as in the past.  Some tension HA.  Disc Mackenzie exercises.  Unable to show dogs like she enjoys. Out to eat once this year.  Good, the same as last visit.  Patient reports stable mood and denies depressed or irritable moods.  Patient denies any recent difficulty with anxiety.  Patient denies difficulty with sleep initiation or maintenance. 6-7 hours with Ambien.  Denies appetite disturbance.  Patient reports that energy and motivation have been good.  Patient denies any difficulty with concentration.  Patient denies any suicidal ideation.  Past Psychiatric Medication Trials: Vyvanse 70, Adderall,  Wellbutrin 300 helped, Abilify, Prozac worsened depression,  Imitrex, Relpax, topiramate  Ambien,  Review of Systems:  Review of Systems  Cardiovascular: Negative for chest pain and palpitations.  Neurological: Positive for headaches. Negative for tremors and weakness.  Psychiatric/Behavioral: Positive for depression. Negative for agitation, behavioral problems, confusion, decreased concentration, dysphoric mood, hallucinations, self-injury, sleep disturbance and suicidal ideas. The  patient is not nervous/anxious and is not hyperactive.     Medications: I have reviewed the patient's current medications.  Current Outpatient Medications  Medication Sig Dispense Refill  . buPROPion (WELLBUTRIN XL) 300 MG 24 hr tablet Take 1 tablet (300 mg total) by mouth every morning. 90 tablet 3  . etonogestrel-ethinyl estradiol (NUVARING) 0.12-0.015 MG/24HR vaginal ring      . lisdexamfetamine (VYVANSE) 70 MG capsule Take 1 capsule (70 mg total) by mouth every morning. 90 capsule 0  . zolpidem (AMBIEN) 10 MG tablet Take 1 tablet (10 mg total) by mouth at bedtime. 90 tablet 1  . lisdexamfetamine (VYVANSE) 70 MG capsule Take 1 capsule (70 mg total) by mouth daily. 30 capsule 0   No current facility-administered medications for this visit.    Medication Side Effects: None except with migraine meds.  Allergies:  Allergies  Allergen Reactions  . Penicillins Rash    rash    History reviewed. No pertinent past medical history.  Family History  Problem Relation Age of Onset  . Breast cancer Paternal Aunt     Social History   Socioeconomic History  . Marital status: Married    Spouse name: Not on file  . Number of children: Not on file  . Years of education: Not on file  . Highest education level: Not on file  Occupational History  . Not on file  Tobacco Use  . Smoking status: Never Smoker  . Smokeless tobacco: Never Used  Substance and Sexual Activity  . Alcohol use: Not Currently  . Drug use: Never  . Sexual activity: Not on file  Other Topics Concern  . Not on file  Social History Narrative  . Not on file   Social Determinants of Health   Financial Resource Strain:   . Difficulty of Paying Living Expenses: Not on file  Food Insecurity:   . Worried About Programme researcher, broadcasting/film/video in the Last Year: Not on file  . Ran Out of Food in the Last Year: Not on file  Transportation Needs:   . Lack of Transportation (Medical): Not on file  . Lack of Transportation (Non-Medical): Not on file  Physical Activity:   . Days of Exercise per Week: Not on file  . Minutes of Exercise per Session: Not on file  Stress:   . Feeling of Stress : Not on file  Social Connections:   . Frequency of Communication with Friends and Family: Not on file  . Frequency of Social Gatherings with Friends and Family: Not on file  . Attends Religious Services: Not on file   . Active Member of Clubs or Organizations: Not on file  . Attends Banker Meetings: Not on file  . Marital Status: Not on file  Intimate Partner Violence:   . Fear of Current or Ex-Partner: Not on file  . Emotionally Abused: Not on file  . Physically Abused: Not on file  . Sexually Abused: Not on file    Past Medical History, Surgical history, Social history, and Family history were reviewed and updated as appropriate.   Please see review of systems for further details on the patient's review from today.   Objective:   Physical Exam:  There were no vitals taken for this visit.  Physical Exam Constitutional:      General: She is not in acute distress.    Appearance: She is well-developed.  Musculoskeletal:        General: No deformity.  Neurological:  Mental Status: She is alert and oriented to person, place, and time.     Coordination: Coordination normal.  Psychiatric:        Attention and Perception: Attention normal. She is attentive.        Mood and Affect: Mood normal. Mood is not anxious or depressed. Affect is not labile, blunt, angry or inappropriate.        Speech: Speech normal. Speech is not rapid and pressured or slurred.        Behavior: Behavior normal.        Thought Content: Thought content normal. Thought content does not include homicidal or suicidal ideation. Thought content does not include homicidal or suicidal plan.        Cognition and Memory: Cognition normal.        Judgment: Judgment normal.     Comments: Insight is good. Irritable gone.     Lab Review:  No results found for: NA, K, CL, CO2, GLUCOSE, BUN, CREATININE, CALCIUM, PROT, ALBUMIN, AST, ALT, ALKPHOS, BILITOT, GFRNONAA, GFRAA  No results found for: WBC, RBC, HGB, HCT, PLT, MCV, MCH, MCHC, RDW, LYMPHSABS, MONOABS, EOSABS, BASOSABS  No results found for: POCLITH, LITHIUM   No results found for: PHENYTOIN, PHENOBARB, VALPROATE, CBMZ   .res Assessment: Plan:     Atypical depression  Attention deficit hyperactivity disorder (ADHD), predominantly inattentive type - Plan: lisdexamfetamine (VYVANSE) 70 MG capsule, lisdexamfetamine (VYVANSE) 70 MG capsule  Migraine without aura and without status migrainosus, not intractable  Insomnia due to mental condition   Good response to medications.  She has been stable for some times.  Depression and ADD are under control with the medications.  She does not want medicine changes. Got more irritable without Wellbutrin so restarted it. Disc time frame of response to antidepressants.  Migraines are manageable somewhat but consider alternative triptan. Or use Zofran for the nausea to allow tolerance of triptan.  Relpax worked better with less SE vs Imitrex. Using Execedrin and follows instructions.   Educate about preventative meds.  Ajovy, Aimovig  No med changes today again.  Was a cautioned about the risk of Ambien causing amnesia.  She is aware and is used using the lowest effective dose.  Ambien helped.    Discussed potential benefits, risks, and side effects of stimulants with patient to include increased heart rate, palpitations, insomnia, increased anxiety, increased irritability, or decreased appetite.  Instructed patient to contact office if experiencing any significant tolerability issues.  Follow-up 6 months  Meredith Staggers, MD, DFAPA  Please see After Visit Summary for patient specific instructions.  No future appointments.  No orders of the defined types were placed in this encounter.     -------------------------------

## 2019-10-22 ENCOUNTER — Telehealth: Payer: Self-pay

## 2019-10-22 NOTE — Telephone Encounter (Signed)
Prior authorization renewal submitted and approved for VYVANSE 70 MG effective 10/16/2019-10/15/2020 with [employer] [facility] PA# [id number]  ID# [id number]

## 2019-12-09 ENCOUNTER — Other Ambulatory Visit: Payer: Self-pay

## 2019-12-09 ENCOUNTER — Encounter: Payer: Self-pay | Admitting: Obstetrics

## 2019-12-09 ENCOUNTER — Ambulatory Visit (INDEPENDENT_AMBULATORY_CARE_PROVIDER_SITE_OTHER): Payer: BC Managed Care – PPO | Admitting: Obstetrics

## 2019-12-09 VITALS — BP 120/76 | HR 90 | Ht 64.0 in | Wt 189.0 lb

## 2019-12-09 DIAGNOSIS — Z23 Encounter for immunization: Secondary | ICD-10-CM

## 2019-12-09 DIAGNOSIS — Z01419 Encounter for gynecological examination (general) (routine) without abnormal findings: Secondary | ICD-10-CM

## 2019-12-09 DIAGNOSIS — Z124 Encounter for screening for malignant neoplasm of cervix: Secondary | ICD-10-CM

## 2019-12-09 NOTE — Progress Notes (Addendum)
Routine Annual Gynecology Examination   PCP: Schermerhorn, Ihor Austin, MD  Chief Complaint:  Chief Complaint  Patient presents with   Gynecologic Exam    [employer] Employee; pap/mammogram    History of Present Illness: Patient is a 54 y.o. [id number] presents for annual exam. The patient has no complaints today.  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 She is married, but has no children. She has 7 dogs at home; and breeds Joy Turner. It has been several years since her last Well Woman Physical. Menopausal bleeding: denies  Menopausal symptoms: denies  Breast symptoms: denies  Last pap smear: 2 years ago.  Result Normal  Last mammogram: 2 years ago.  Result Normal  Past Medical History:  Diagnosis Date   ADHD    Depression    Insomnia    Migraines     Past Surgical History:  Procedure Laterality Date   COLONOSCOPY W/ POLYPECTOMY     WISDOM TOOTH EXTRACTION     all four    Prior to Admission medications   Medication Sig Start Date End Date Taking? Authorizing Provider  buPROPion (WELLBUTRIN XL) 300 MG 24 hr tablet Take 1 tablet (300 mg total) by mouth every morning. 03/27/19  Yes Cottle, Steva Ready., MD  etonogestrel-ethinyl estradiol [name]) 0.12-0.015 MG/24HR vaginal ring  02/19/18  Yes [provider]  lisdexamfetamine (VYVANSE) 70 MG capsule Take 1 capsule (70 mg total) by mouth every morning. 10/15/19  Yes Cottle, Steva Ready., MD  zolpidem (AMBIEN) 10 MG tablet Take 1 tablet (10 mg total) by mouth at bedtime. 10/15/19  Yes Cottle, Steva Ready., MD    Allergies  Allergen Reactions   Penicillins Rash    rash    Gynecologic History:  No LMP recorded. (Menstrual status: Other). Contraception: NuvaRing vaginal inserts Last Pap: 2019. Results were: normal Last mammogram: 2019. Results were: normal  Obstetric History: [id number]  Social History   Socioeconomic History   Marital status: Married    Spouse name: Not on file    Number of children: Not on file   Years of education: Not on file   Highest education level: Not on file  Occupational History   Not on file  Tobacco Use   Smoking status: Former Smoker   Smokeless tobacco: Never Used  Building services engineer Use: Never used  Substance and Sexual Activity   Alcohol use: Yes    Comment: rarely   Drug use: Never   Sexual activity: Yes    Birth control/protection: Inserts  Other Topics Concern   Not on file  Social History Narrative   Not on file   Social Determinants of Health   Financial Resource Strain:    Difficulty of Paying Living Expenses: Not on file  Food Insecurity:    Worried About Programme researcher, broadcasting/film/video in the Last Year: Not on file   [employer] of Food in the Last Year: Not on file  Transportation Needs:    Lack of Transportation (Medical): Not on file   Lack of Transportation (Non-Medical): Not on file  Physical Activity:    Days of Exercise per Week: Not on file   Minutes of Exercise per Session: Not on file  Stress:    Feeling of Stress : Not on file  Social Connections:    Frequency of Communication with Friends and Family: Not on file   Frequency of Social Gatherings with Friends and Family: Not on file   Attends Religious Services:  Not on file   Active Member of Clubs or Organizations: Not on file   Attends Club or Organization Meetings: Not on file   Marital Status: Not on file  Intimate Partner Violence:    Fear of Current or Ex-Partner: Not on file   Emotionally Abused: Not on file   Physically Abused: Not on file   Sexually Abused: Not on file    Family History  Problem Relation Age of Onset   Breast cancer Paternal Aunt     Review of Systems  Constitutional: Negative.   HENT: Negative.   Eyes: Negative.   Cardiovascular: Negative.   Gastrointestinal: Negative.   Genitourinary: Negative.   Skin: Negative.   Neurological: Positive for headaches.       She repots a hx of headaches.  Previously on migraine medication, but now uses Exedrin for migraines  Endo/Heme/Allergies: Negative.   Psychiatric/Behavioral:       Denies any sxs of depression or anxiety today. Hx of depression. Under the care of a psychiatrist.      Physical Exam Vitals: BP 120/76    Pulse 90    Ht 5\' 4"  (1.626 m)    Wt 189 lb (85.7 kg)    BMI 32.44 kg/m   Physical Exam Constitutional:      Appearance: Normal appearance. She is obese.  Genitourinary:     Vulva normal.  HENT:     Head: Normocephalic and atraumatic.     Nose: Nose normal.  Cardiovascular:     Rate and Rhythm: Normal rate and regular rhythm.     Pulses: Normal pulses.     Heart sounds: Normal heart sounds.  Pulmonary:     Effort: Pulmonary effort is normal.     Breath sounds: Normal breath sounds.  Abdominal:     General: Bowel sounds are normal.     Palpations: Abdomen is soft.  Musculoskeletal:        General: Normal range of motion.     Cervical back: Normal range of motion and neck supple.  Neurological:     Mental Status: She is alert and oriented to person, place, and time.  Skin:    General: Skin is warm and dry.  Psychiatric:        Behavior: Behavior normal.      Female chaperone present for pelvic and breast  portions of the physical exam      Assessment and Plan:  54 y.o. 57 female here for routine annual gynecologic examination  Plan: Problem List Items Addressed This Visit    None    Visit Diagnoses    Need for influenza vaccination    -  Primary   Relevant Orders   Flu Vaccine QUAD 36+ mos IM (Fluarix, Quad PF) (Completed)   Women's annual routine gynecological examination       Relevant Orders   IGP, Aptima HPV   Cervical cancer screening       Relevant Orders   IGP, Aptima HPV      Screening: -- Blood pressure screen normal -- Colonoscopy - not due -- Mammogram - due. Patient to call [facility] to arrange. She understands that it is her responsibility to arrange this. --  Weight screening: obese: discussed management options, including lifestyle, dietary, and exercise. -- Depression screening negative - managed by her Psychiatrist. -- Nutrition: normal -- cholesterol screening: per PCP -- osteoporosis screening: not due -- tobacco screening: not using -- alcohol screening: AUDIT questionnaire indicates low-risk usage. -- family history of  breast cancer screening: done. not at high risk. -- no evidence of domestic violence or intimate partner violence. -- STD screening: gonorrhea/chlamydia NAAT not collected per patient request. -- pap smear collected per ASCCP guidelines -- flu vaccine given today -- HPV vaccination series: not eligilbe  Mirna Mires, CNM  12/09/2019 4:37 PM   12/09/2019 4:32 PM

## 2019-12-12 LAB — IGP, APTIMA HPV: HPV Aptima: NEGATIVE

## 2019-12-13 ENCOUNTER — Encounter: Payer: Self-pay | Admitting: Obstetrics

## 2020-01-28 ENCOUNTER — Telehealth: Payer: Self-pay | Admitting: Psychiatry

## 2020-01-28 ENCOUNTER — Other Ambulatory Visit: Payer: Self-pay | Admitting: Psychiatry

## 2020-01-28 DIAGNOSIS — F900 Attention-deficit hyperactivity disorder, predominantly inattentive type: Secondary | ICD-10-CM

## 2020-01-28 MED ORDER — LISDEXAMFETAMINE DIMESYLATE 70 MG PO CAPS: 70.0000 mg | ORAL_CAPSULE | ORAL | 0 refills | Status: DC

## 2020-01-28 NOTE — Telephone Encounter (Signed)
Joy Turner calledt to request refill of her Vyvanse.  appt 04/13/20. Send to [facility] order - 90 day supply

## 2020-02-04 ENCOUNTER — Other Ambulatory Visit: Payer: Self-pay

## 2020-02-04 ENCOUNTER — Ambulatory Visit
Admission: RE | Admit: 2020-02-04 | Discharge: 2020-02-04 | Disposition: A | Discharge status: Home / Self Care | Payer: BC Managed Care – PPO | Source: Ambulatory Visit | Attending: Obstetrics | Admitting: Obstetrics

## 2020-02-04 DIAGNOSIS — Z1231 Encounter for screening mammogram for malignant neoplasm of breast: Secondary | ICD-10-CM | POA: Diagnosis not present

## 2020-02-04 DIAGNOSIS — Z01419 Encounter for gynecological examination (general) (routine) without abnormal findings: Secondary | ICD-10-CM

## 2020-03-18 ENCOUNTER — Other Ambulatory Visit: Payer: Self-pay | Admitting: Psychiatry

## 2020-03-18 DIAGNOSIS — F3289 Other specified depressive episodes: Secondary | ICD-10-CM

## 2020-04-07 ENCOUNTER — Other Ambulatory Visit: Payer: Self-pay | Admitting: Psychiatry

## 2020-04-07 DIAGNOSIS — F5105 Insomnia due to other mental disorder: Secondary | ICD-10-CM

## 2020-04-13 ENCOUNTER — Encounter: Payer: Self-pay | Admitting: Psychiatry

## 2020-04-13 ENCOUNTER — Telehealth (INDEPENDENT_AMBULATORY_CARE_PROVIDER_SITE_OTHER): Payer: BC Managed Care – PPO | Admitting: Psychiatry

## 2020-04-13 DIAGNOSIS — F3289 Other specified depressive episodes: Secondary | ICD-10-CM | POA: Diagnosis not present

## 2020-04-13 DIAGNOSIS — F5105 Insomnia due to other mental disorder: Secondary | ICD-10-CM

## 2020-04-13 DIAGNOSIS — G43009 Migraine without aura, not intractable, without status migrainosus: Secondary | ICD-10-CM | POA: Diagnosis not present

## 2020-04-13 DIAGNOSIS — F900 Attention-deficit hyperactivity disorder, predominantly inattentive type: Secondary | ICD-10-CM | POA: Diagnosis not present

## 2020-04-13 MED ORDER — LISDEXAMFETAMINE DIMESYLATE 70 MG PO CAPS: 70.0000 mg | ORAL_CAPSULE | ORAL | 0 refills | Status: DC

## 2020-04-13 MED ORDER — ZOLPIDEM TARTRATE 10 MG PO TABS: 10.0000 mg | ORAL_TABLET | Freq: Every day | ORAL | 1 refills | Status: DC

## 2020-04-13 MED ORDER — BUPROPION HCL ER (XL) 300 MG PO TB24: 300.0000 mg | ORAL_TABLET | Freq: Every morning | ORAL | 1 refills | Status: DC

## 2020-04-13 NOTE — Progress Notes (Signed)
MAGIN BALBI [id number] [birth year] 55 y.o.  Video Visit via My Chart  I connected with pt by My Chart and verified that I am speaking with the correct person using two identifiers.   I discussed the limitations, risks, security and privacy concerns of performing an evaluation and management service by My Chart  and the availability of in person appointments. I also discussed with the patient that there may be a patient responsible charge related to this service. The patient expressed understanding and agreed to proceed.  I discussed the assessment and treatment plan with the patient. The patient was provided an opportunity to ask questions and all were answered. The patient agreed with the plan and demonstrated an understanding of the instructions.   The patient was advised to call back or seek an in-person evaluation if the symptoms worsen or if the condition fails to improve as anticipated.  I provided 30 minutes of video time during this encounter.  The patient was located at home and the provider was located office. Session started 10 and ended at 10:30 AM  Subjective:   Patient ID:  ARTHEA NOBEL is a 55 y.o. (DOB 08-21-1965) female.  Chief Complaint:  Chief Complaint  Patient presents with  . Follow-up  . Atypical depression  . ADHD    Depression        Associated symptoms include headaches.  Associated symptoms include no decreased concentration and no suicidal ideas. Medication Refill Associated symptoms include headaches. Pertinent negatives include no chest pain or weakness.   Jeidi L Scharf presents to the office today for follow-up of atypical depression and ADD.  seen August, 2020.  No meds were changed.  She has been stable on the same medication for quite some period of time.   10/2019 appt noted: She's remained well.  Not much change bc work from home anyway.  H now works from home too for [employer].  Doing well with the meds and no new concerns.  Focus still  good with Vyvanse.  Pleased. Productive.  Work is OK from home.  Went off Wellbutrin for 4 weeks DT late in the mail and decided to stop it.  Got more depressed and irritable without it.  Stayed with work from home and away from [employer].  Not using migraine meds bc nausea and cant get Relpax.  They are milder now..  Usually last less than a day.  A few times monthly. Takes OTC.  Not as bad as in the past.  Some tension HA.  Disc Mackenzie exercises.  04/13/2020 appointment with following noted: Still able to get Vyvanse but hasn't filled it this year.    Still pleased with this. Still pleased with the meds.  No SE. She works for [employer]. Mood is steady on Wellbutrin and maintained benefit with less irritability vs off it.  In past had problem with napping and drowsiness and no problem with it now.    Good, the same as last visit.  Patient reports stable mood and denies depressed or irritable moods.  Patient denies any recent difficulty with anxiety.  Patient denies difficulty with sleep initiation and 6-7 hours with Ambien. Mild awakening. Some nocturia.  Denies appetite disturbance.  Patient reports that energy and motivation have been good.  Patient denies any difficulty with concentration.  Patient denies any suicidal ideation.  Past Psychiatric Medication Trials: Vyvanse 70, Adderall,   Wellbutrin 300 helped, Abilify, Prozac worsened depression,  Imitrex, Relpax, topiramate  Ambien,   Review of Systems:  Review of Systems  Cardiovascular: Negative for chest pain and palpitations.  Neurological: Positive for headaches. Negative for dizziness, tremors and weakness.  Psychiatric/Behavioral: Positive for depression. Negative for agitation, behavioral problems, confusion, decreased concentration, dysphoric mood, hallucinations, self-injury, sleep disturbance and suicidal ideas. The patient is not nervous/anxious and is not hyperactive.     Medications: I have reviewed the patient's current  medications.  Current Outpatient Medications  Medication Sig Dispense Refill  . etonogestrel-ethinyl estradiol (NUVARING) 0.12-0.015 MG/24HR vaginal ring     . buPROPion (WELLBUTRIN XL) 300 MG 24 hr tablet Take 1 tablet (300 mg total) by mouth every morning. 90 tablet 1  . lisdexamfetamine (VYVANSE) 70 MG capsule Take 1 capsule (70 mg total) by mouth every morning. 90 capsule 0  . zolpidem (AMBIEN) 10 MG tablet Take 1 tablet (10 mg total) by mouth at bedtime. 90 tablet 1   No current facility-administered medications for this visit.    Medication Side Effects: None except with migraine meds.  Allergies:  Allergies  Allergen Reactions  . Penicillins Rash    rash    Past Medical History:  Diagnosis Date  . ADHD   . Depression   . Insomnia   . Migraines     Family History  Problem Relation Age of Onset  . Breast cancer Paternal Aunt     Social History   Socioeconomic History  . Marital status: Married    Spouse name: Not on file  . Number of children: Not on file  . Years of education: Not on file  . Highest education level: Not on file  Occupational History  . Not on file  Tobacco Use  . Smoking status: Former Games developer  . Smokeless tobacco: Never Used  Vaping Use  . Vaping Use: Never used  Substance and Sexual Activity  . Alcohol use: Yes    Comment: rarely  . Drug use: Never  . Sexual activity: Yes    Birth control/protection: Inserts  Other Topics Concern  . Not on file  Social History Narrative  . Not on file   Social Determinants of Health   Financial Resource Strain: Not on file  Food Insecurity: Not on file  Transportation Needs: Not on file  Physical Activity: Not on file  Stress: Not on file  Social Connections: Not on file  Intimate Partner Violence: Not on file    Past Medical History, Surgical history, Social history, and Family history were reviewed and updated as appropriate.   Please see review of systems for further details on the  patient's review from today.   Objective:   Physical Exam:  There were no vitals taken for this visit.  Physical Exam Constitutional:      General: She is not in acute distress.    Appearance: She is well-developed.  Musculoskeletal:        General: No deformity.  Neurological:     Mental Status: She is alert and oriented to person, place, and time.     Coordination: Coordination normal.  Psychiatric:        Attention and Perception: Attention normal. She is attentive.        Mood and Affect: Mood normal. Mood is not anxious or depressed. Affect is not labile, blunt, angry or inappropriate.        Speech: Speech normal. Speech is not rapid and pressured or slurred.        Behavior: Behavior normal.        Thought Content: Thought content normal.  Thought content does not include homicidal or suicidal ideation. Thought content does not include homicidal or suicidal plan.        Cognition and Memory: Cognition normal.        Judgment: Judgment normal.     Comments: Insight is good. Irritable managed.     Lab Review:  No results found for: NA, K, CL, CO2, GLUCOSE, BUN, CREATININE, CALCIUM, PROT, ALBUMIN, AST, ALT, ALKPHOS, BILITOT, GFRNONAA, GFRAA  No results found for: WBC, RBC, HGB, HCT, PLT, MCV, MCH, MCHC, RDW, LYMPHSABS, MONOABS, EOSABS, BASOSABS  No results found for: POCLITH, LITHIUM   No results found for: PHENYTOIN, PHENOBARB, VALPROATE, CBMZ   .res Assessment: Plan:    Atypical depression - Plan: buPROPion (WELLBUTRIN XL) 300 MG 24 hr tablet  Attention deficit hyperactivity disorder (ADHD), predominantly inattentive type - Plan: lisdexamfetamine (VYVANSE) 70 MG capsule  Migraine without aura and without status migrainosus, not intractable  Insomnia due to mental condition - Plan: zolpidem (AMBIEN) 10 MG tablet   Good response to medications.  She has been stable for some times.  Depression and ADD are under control with the medications.  She does not want  medicine changes. Got more irritable without Wellbutrin so restarted it. Disc time frame of response to antidepressants.  Migraines are manageable somewhat but consider alternative triptan. Or use Zofran for the nausea to allow tolerance of triptan.  Relpax worked better with less SE vs Imitrex. Using Execedrin and follows instructions.   Educate about preventative meds.  Ajovy, Aimovig  No med changes today again.  Disc Vyvanse problems with [employer].  Disc alternative of Concerta if this is a problem.   Was a cautioned about the risk of Ambien causing amnesia.  She is aware and is used using the lowest effective dose.  Ambien helped.   Option Ambien CR.  Discussed potential benefits, risks, and side effects of stimulants with patient to include increased heart rate, palpitations, insomnia, increased anxiety, increased irritability, or decreased appetite.  Instructed patient to contact office if experiencing any significant tolerability issues.  Follow-up 6 months or sooner  Meredith Staggers, MD, DFAPA  Please see After Visit Summary for patient specific instructions.  No future appointments.  No orders of the defined types were placed in this encounter.     -------------------------------

## 2020-07-14 ENCOUNTER — Telehealth: Payer: Self-pay | Admitting: Psychiatry

## 2020-07-14 ENCOUNTER — Other Ambulatory Visit: Payer: Self-pay

## 2020-07-14 DIAGNOSIS — F900 Attention-deficit hyperactivity disorder, predominantly inattentive type: Secondary | ICD-10-CM

## 2020-07-14 MED ORDER — LISDEXAMFETAMINE DIMESYLATE 70 MG PO CAPS: 70.0000 mg | ORAL_CAPSULE | ORAL | 0 refills | Status: DC

## 2020-07-14 NOTE — Telephone Encounter (Signed)
Joy Turner called to request refill of her Vyvanse.  Appt 10/15/20.  Send 90 day supply to [facility] order.

## 2020-07-14 NOTE — Telephone Encounter (Signed)
pended

## 2020-08-26 ENCOUNTER — Other Ambulatory Visit: Payer: Self-pay | Admitting: Psychiatry

## 2020-08-26 DIAGNOSIS — F3289 Other specified depressive episodes: Secondary | ICD-10-CM

## 2020-10-15 ENCOUNTER — Telehealth (INDEPENDENT_AMBULATORY_CARE_PROVIDER_SITE_OTHER): Payer: BC Managed Care – PPO | Admitting: Psychiatry

## 2020-10-15 ENCOUNTER — Encounter: Payer: Self-pay | Admitting: Psychiatry

## 2020-10-15 DIAGNOSIS — F900 Attention-deficit hyperactivity disorder, predominantly inattentive type: Secondary | ICD-10-CM | POA: Diagnosis not present

## 2020-10-15 DIAGNOSIS — F5105 Insomnia due to other mental disorder: Secondary | ICD-10-CM | POA: Diagnosis not present

## 2020-10-15 DIAGNOSIS — F3289 Other specified depressive episodes: Secondary | ICD-10-CM

## 2020-10-15 DIAGNOSIS — G43009 Migraine without aura, not intractable, without status migrainosus: Secondary | ICD-10-CM

## 2020-10-15 MED ORDER — BUPROPION HCL ER (XL) 300 MG PO TB24: 300.0000 mg | ORAL_TABLET | Freq: Every morning | ORAL | 1 refills | Status: DC

## 2020-10-15 MED ORDER — ZOLPIDEM TARTRATE 10 MG PO TABS: 10.0000 mg | ORAL_TABLET | Freq: Every day | ORAL | 1 refills | Status: DC

## 2020-10-15 MED ORDER — LISDEXAMFETAMINE DIMESYLATE 70 MG PO CAPS: 70.0000 mg | ORAL_CAPSULE | ORAL | 0 refills | Status: DC

## 2020-10-15 NOTE — Progress Notes (Signed)
Joy Turner [id number] February 06, 1965 55 y.o.  Video Visit via My Chart  I connected with pt by My Chart and verified that I am speaking with the correct person using two identifiers.   I discussed the limitations, risks, security and privacy concerns of performing an evaluation and management service by My Chart  and the availability of in person appointments. I also discussed with the patient that there may be a patient responsible charge related to this service. The patient expressed understanding and agreed to proceed.  I discussed the assessment and treatment plan with the patient. The patient was provided an opportunity to ask questions and all were answered. The patient agreed with the plan and demonstrated an understanding of the instructions.   The patient was advised to call back or seek an in-person evaluation if the symptoms worsen or if the condition fails to improve as anticipated.  I provided 30 minutes of video time during this encounter.  The patient was located at home and the provider was located office. Session started 900 and ended at 9:30 AM  Subjective:   Patient ID:  Joy Turner is a 55 y.o. (DOB [birth year]) female.  Chief Complaint:  Chief Complaint  Patient presents with   Follow-up    Mood    ADHD    Depression        Associated symptoms include headaches.  Associated symptoms include no decreased concentration and no suicidal ideas. Medication Refill Associated symptoms include headaches and neck pain. Pertinent negatives include no chest pain or weakness.  Joy Turner presents to the office today for follow-up of atypical depression and ADD.  seen August, 2020.  No meds were changed.  She has been stable on the same medication for quite some period of time.   10/2019 appt noted: She's remained well.  Not much change bc work from home anyway.  H now works from home too for [employer].  Doing well with the meds and no new concerns.  Focus still good  with Vyvanse.  Pleased. Productive.  Work is OK from home.  Went off Wellbutrin for 4 weeks DT late in the mail and decided to stop it.  Got more depressed and irritable without it.  Stayed with work from home and away from [employer].  Not using migraine meds bc nausea and cant get Relpax.  They are milder now..  Usually last less than a day.  A few times monthly. Takes OTC.  Not as bad as in the past.  Some tension HA.  Disc Joy Turner exercises.  04/13/2020 appointment with following noted: Still able to get Vyvanse but hasn't filled it this year.    Still pleased with this. Still pleased with the meds.  No SE. She works for [employer]. Mood is steady on Wellbutrin and maintained benefit with less irritability vs off it.  In past had problem with napping and drowsiness and no problem with it now.   Plan no med changes  10/15/20 appt noted: Less traditional migraine. Had wellness exam.  Had lost 30# over the last year cutting carbs and snacks.  H lost weight too.  No longer obese.  Works [employer]. Adequate duration with Vyvanse.   Satisfied with meds.  No SE. Normal BP and pulse  Good, the same as last visit.  Patient reports stable mood and denies depressed or irritable moods.  Patient denies any recent difficulty with anxiety.  Patient denies difficulty with sleep initiation and 6-7 hours with Ambien. Mild awakening  usually with pain.. Some nocturia.  Denies appetite disturbance.  Patient reports that energy and motivation have been good.  Patient denies any difficulty with concentration.  Patient denies any suicidal ideation.  Past Psychiatric Medication Trials: Vyvanse 70, Adderall,   Wellbutrin 300 helped, Prozac worsened depression,  Abilify,  Imitrex, Relpax, topiramate  Ambien,   Review of Systems:  Review of Systems  Cardiovascular:  Negative for chest pain and palpitations.  Musculoskeletal:  Positive for neck pain.  Neurological:  Positive for headaches. Negative for dizziness,  tremors and weakness.  Psychiatric/Behavioral:  Positive for depression. Negative for agitation, behavioral problems, confusion, decreased concentration, dysphoric mood, hallucinations, self-injury, sleep disturbance and suicidal ideas. The patient is not nervous/anxious and is not hyperactive.    Medications: I have reviewed the patient's current medications.  Current Outpatient Medications  Medication Sig Dispense Refill   etonogestrel-ethinyl estradiol (NUVARING) 0.12-0.015 MG/24HR vaginal ring      buPROPion (WELLBUTRIN XL) 300 MG 24 hr tablet Take 1 tablet (300 mg total) by mouth every morning. 90 tablet 1   lisdexamfetamine (VYVANSE) 70 MG capsule Take 1 capsule (70 mg total) by mouth every morning. 90 capsule 0   zolpidem (AMBIEN) 10 MG tablet Take 1 tablet (10 mg total) by mouth at bedtime. 90 tablet 1   No current facility-administered medications for this visit.    Medication Side Effects: None except with migraine meds.  Allergies:  Allergies  Allergen Reactions   Penicillins Rash    rash    Past Medical History:  Diagnosis Date   ADHD    Depression    Insomnia    Migraines     Family History  Problem Relation Age of Onset   Breast cancer Paternal Aunt     Social History   Socioeconomic History   Marital status: Married    Spouse name: Not on file   Number of children: Not on file   Years of education: Not on file   Highest education level: Not on file  Occupational History   Not on file  Tobacco Use   Smoking status: Former   Smokeless tobacco: Never  Vaping Use   Vaping Use: Never used  Substance and Sexual Activity   Alcohol use: Yes    Comment: rarely   Drug use: Never   Sexual activity: Yes    Birth control/protection: Inserts  Other Topics Concern   Not on file  Social History Narrative   Not on file   Social Determinants of Health   Financial Resource Strain: Not on file  Food Insecurity: Not on file  Transportation Needs: Not on  file  Physical Activity: Not on file  Stress: Not on file  Social Connections: Not on file  Intimate Partner Violence: Not on file    Past Medical History, Surgical history, Social history, and Family history were reviewed and updated as appropriate.   Please see review of systems for further details on the patient's review from today.   Objective:   Physical Exam:  There were no vitals taken for this visit.  Physical Exam Constitutional:      General: She is not in acute distress.    Appearance: She is well-developed.  Musculoskeletal:        General: No deformity.  Neurological:     Mental Status: She is alert and oriented to person, place, and time.     Coordination: Coordination normal.  Psychiatric:        Attention and Perception: Attention normal.  She is attentive.        Mood and Affect: Mood normal. Mood is not anxious or depressed. Affect is not labile, blunt, angry or inappropriate.        Speech: Speech normal. Speech is not rapid and pressured or slurred.        Behavior: Behavior normal.        Thought Content: Thought content normal. Thought content does not include homicidal or suicidal ideation. Thought content does not include homicidal or suicidal plan.        Cognition and Memory: Cognition normal.        Judgment: Judgment normal.     Comments: Insight is good. Irritable managed.  Not depressed.    Lab Review:  No results found for: NA, K, CL, CO2, GLUCOSE, BUN, CREATININE, CALCIUM, PROT, ALBUMIN, AST, ALT, ALKPHOS, BILITOT, GFRNONAA, GFRAA  No results found for: WBC, RBC, HGB, HCT, PLT, MCV, MCH, MCHC, RDW, LYMPHSABS, MONOABS, EOSABS, BASOSABS  No results found for: POCLITH, LITHIUM   No results found for: PHENYTOIN, PHENOBARB, VALPROATE, CBMZ   .res Assessment: Plan:    Atypical depression - Plan: buPROPion (WELLBUTRIN XL) 300 MG 24 hr tablet  Attention deficit hyperactivity disorder (ADHD), predominantly inattentive type - Plan:  lisdexamfetamine (VYVANSE) 70 MG capsule  Migraine without aura and without status migrainosus, not intractable  Insomnia due to mental condition - Plan: zolpidem (AMBIEN) 10 MG tablet   Good response to medications.  She has been stable for some times.  Depression and ADD are under control with the medications.  She does not want medicine changes. Got more irritable without Wellbutrin so restarted it. Disc time frame of response to antidepressants.  Migraines are manageable without severity anymore.    No med changes today again.  Disc Vyvanse problems with [employer].  Disc alternative of Concerta if this is a problem.   Was a cautioned about the risk of Ambien causing amnesia.  She is aware and is used using the lowest effective dose.  Ambien helped.   Option Ambien CR.  Discussed potential benefits, risks, and side effects of stimulants with patient to include increased heart rate, palpitations, insomnia, increased anxiety, increased irritability, or decreased appetite.  Instructed patient to contact office if experiencing any significant tolerability issues.  Follow-up 6 months or sooner  Meredith Staggers, MD, DFAPA  Please see After Visit Summary for patient specific instructions.  No future appointments.  No orders of the defined types were placed in this encounter.     -------------------------------

## 2021-01-28 ENCOUNTER — Telehealth: Payer: Self-pay | Admitting: Psychiatry

## 2021-01-28 ENCOUNTER — Other Ambulatory Visit: Payer: Self-pay

## 2021-01-28 DIAGNOSIS — F900 Attention-deficit hyperactivity disorder, predominantly inattentive type: Secondary | ICD-10-CM

## 2021-01-28 MED ORDER — LISDEXAMFETAMINE DIMESYLATE 70 MG PO CAPS
70.0000 mg | ORAL_CAPSULE | ORAL | 0 refills | Status: DC
Start: 2021-01-28 — End: 2021-04-01

## 2021-01-28 NOTE — Telephone Encounter (Signed)
Next visit is 04/01/21. Requesting refill on Vyvanse called to:  [facility]) - [place], [place] - [address] [place]  Phone # [phone] Fax# [phone]

## 2021-01-28 NOTE — Telephone Encounter (Signed)
Pended.

## 2021-04-01 ENCOUNTER — Telehealth (INDEPENDENT_AMBULATORY_CARE_PROVIDER_SITE_OTHER): Payer: BC Managed Care – PPO | Admitting: Psychiatry

## 2021-04-01 ENCOUNTER — Encounter: Payer: Self-pay | Admitting: Psychiatry

## 2021-04-01 DIAGNOSIS — F3289 Other specified depressive episodes: Secondary | ICD-10-CM | POA: Diagnosis not present

## 2021-04-01 DIAGNOSIS — G43009 Migraine without aura, not intractable, without status migrainosus: Secondary | ICD-10-CM

## 2021-04-01 DIAGNOSIS — F900 Attention-deficit hyperactivity disorder, predominantly inattentive type: Secondary | ICD-10-CM

## 2021-04-01 DIAGNOSIS — F5105 Insomnia due to other mental disorder: Secondary | ICD-10-CM | POA: Diagnosis not present

## 2021-04-01 MED ORDER — BUPROPION HCL ER (XL) 300 MG PO TB24: 300.0000 mg | ORAL_TABLET | Freq: Every morning | ORAL | 1 refills | Status: DC

## 2021-04-01 MED ORDER — LISDEXAMFETAMINE DIMESYLATE 70 MG PO CAPS: 70.0000 mg | ORAL_CAPSULE | ORAL | 0 refills | Status: DC

## 2021-04-01 MED ORDER — ZOLPIDEM TARTRATE 10 MG PO TABS: 10.0000 mg | ORAL_TABLET | Freq: Every day | ORAL | 1 refills | Status: DC

## 2021-04-01 NOTE — Progress Notes (Signed)
Joy Turner [id number] 1965/02/16 56 y.o.  Video Visit via My Chart  I connected with pt by My Chart and verified that I am speaking with the correct person using two identifiers.   I discussed the limitations, risks, security and privacy concerns of performing an evaluation and management service by My Chart  and the availability of in person appointments. I also discussed with the patient that there may be a patient responsible charge related to this service. The patient expressed understanding and agreed to proceed.  I discussed the assessment and treatment plan with the patient. The patient was provided an opportunity to ask questions and all were answered. The patient agreed with the plan and demonstrated an understanding of the instructions.   The patient was advised to call back or seek an in-person evaluation if the symptoms worsen or if the condition fails to improve as anticipated.  I provided 30 minutes of video time during this encounter.  The patient was located at home and the provider was located office. Session started 1000 and ended at 10:30 AM  Subjective:   Patient ID:  Joy Turner is a 56 y.o. (DOB 29-Dec-1965) female.  Chief Complaint:  Chief Complaint  Patient presents with   Follow-up   Atypical depression   Attention deficit hyperactivity disorder (ADHD), predominan    Depression        Associated symptoms include headaches.  Associated symptoms include no decreased concentration and no suicidal ideas. Medication Refill Associated symptoms include headaches and neck pain. Pertinent negatives include no weakness.  Joy Turner presents to the office today for follow-up of atypical depression and ADD.  seen August, 2020.  No meds were changed.  She has been stable on the same medication for quite some period of time.   10/2019 appt noted: She's remained well.  Not much change bc work from home anyway.  H now works from home too for [employer].  Doing  well with the meds and no new concerns.  Focus still good with Vyvanse.  Pleased. Productive.  Work is OK from home.  Went off Wellbutrin for 4 weeks DT late in the mail and decided to stop it.  Got more depressed and irritable without it.  Stayed with work from home and away from [employer].  Not using migraine meds bc nausea and cant get Relpax.  They are milder now..  Usually last less than a day.  A few times monthly. Takes OTC.  Not as bad as in the past.  Some tension HA.  Disc [place] exercises.  04/13/2020 appointment with following noted: Still able to get Vyvanse but hasn't filled it this year.    Still pleased with this. Still pleased with the meds.  No SE. She works for [employer]. Mood is steady on Wellbutrin and maintained benefit with less irritability vs off it.  In past had problem with napping and drowsiness and no problem with it now.   Plan no med changes  10/15/20 appt noted: Less traditional migraine. Had wellness exam.  Had lost 30# over the last year cutting carbs and snacks.  H lost weight too.  No longer obese.  Works [employer]. Adequate duration with Vyvanse.   Satisfied with meds.  No SE. Normal BP and pulse Plan no changes  04/01/21 appt noted: Still on same meds.   Doing well overall. No sig depression.   No SE. Working from home.   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  Patient reports stable mood and denies depressed or irritable moods.  Patient denies any recent difficulty with anxiety.  Patient denies difficulty with sleep initiation and 6-7 hours with Ambien. Mild awakening usually with pain.Marland Kitchenotherwise satisfied with Zplpidem but may have some awakening.  Consistent with routine. Some nocturia.  Denies appetite disturbance.  Patient reports that energy and motivation have been good.  Patient denies any difficulty with concentration.  Patient denies any suicidal ideation.  Past Psychiatric Medication Trials: Vyvanse 70,  Adderall,   Wellbutrin 300 helped, Prozac worsened depression,  Abilify,  Imitrex, Relpax, topiramate  Ambien,   Review of Systems:  Review of Systems  Musculoskeletal:  Positive for neck pain.  Neurological:  Positive for headaches. Negative for dizziness, tremors and weakness.  Psychiatric/Behavioral:  Negative for agitation, behavioral problems, confusion, decreased concentration, dysphoric mood, hallucinations, self-injury, sleep disturbance and suicidal ideas. The patient is not nervous/anxious and is not hyperactive.    Medications: I have reviewed the patient's current medications.  Current Outpatient Medications  Medication Sig Dispense Refill   etonogestrel-ethinyl estradiol (NUVARING) 0.12-0.015 MG/24HR vaginal ring      buPROPion (WELLBUTRIN XL) 300 MG 24 hr tablet Take 1 tablet (300 mg total) by mouth every morning. 90 tablet 1   lisdexamfetamine (VYVANSE) 70 MG capsule Take 1 capsule (70 mg total) by mouth every morning. 90 capsule 0   zolpidem (AMBIEN) 10 MG tablet Take 1 tablet (10 mg total) by mouth at bedtime. 90 tablet 1   No current facility-administered medications for this visit.    Medication Side Effects: None except with migraine meds.  Allergies:  Allergies  Allergen Reactions   Penicillins Rash    rash    Past Medical History:  Diagnosis Date   ADHD    Depression    Insomnia    Migraines     Family History  Problem Relation Age of Onset   Breast cancer Paternal Aunt     Social History   Socioeconomic History   Marital status: Married    Spouse name: Not on file   Number of children: Not on file   Years of education: Not on file   Highest education level: Not on file  Occupational History   Not on file  Tobacco Use   Smoking status: Former   Smokeless tobacco: Never  Vaping Use   Vaping Use: Never used  Substance and Sexual Activity   Alcohol use: Yes    Comment: rarely   Drug use: Never   Sexual activity: Yes    Birth  control/protection: Inserts  Other Topics Concern   Not on file  Social History Narrative   Not on file   Social Determinants of Health   Financial Resource Strain: Not on file  Food Insecurity: Not on file  Transportation Needs: Not on file  Physical Activity: Not on file  Stress: Not on file  Social Connections: Not on file  Intimate Partner Violence: Not on file    Past Medical History, Surgical history, Social history, and Family history were reviewed and updated as appropriate.   Please see review of systems for further details on the patient's review from today.   Objective:   Physical Exam:  There were no vitals taken for this visit.  Physical Exam Neurological:     Mental Status: She is alert and oriented to person, place, and time.     Cranial Nerves: No dysarthria.  Psychiatric:  Attention and Perception: Attention and perception normal.        Mood and Affect: Mood normal. Mood is not anxious.        Speech: Speech normal.        Behavior: Behavior is cooperative.        Thought Content: Thought content normal. Thought content is not paranoid or delusional. Thought content does not include homicidal or suicidal ideation. Thought content does not include suicidal plan.        Cognition and Memory: Cognition and memory normal.        Judgment: Judgment normal.     Comments: Insight intact    Lab Review:  No results found for: NA, K, CL, CO2, GLUCOSE, BUN, CREATININE, CALCIUM, PROT, ALBUMIN, AST, ALT, ALKPHOS, BILITOT, GFRNONAA, GFRAA  No results found for: WBC, RBC, HGB, HCT, PLT, MCV, MCH, MCHC, RDW, LYMPHSABS, MONOABS, EOSABS, BASOSABS  No results found for: POCLITH, LITHIUM   No results found for: PHENYTOIN, PHENOBARB, VALPROATE, CBMZ   .res Assessment: Plan:    Atypical depression - Plan: buPROPion (WELLBUTRIN XL) 300 MG 24 hr tablet  Attention deficit hyperactivity disorder (ADHD), predominantly inattentive type - Plan: lisdexamfetamine  (VYVANSE) 70 MG capsule  Migraine without aura and without status migrainosus, not intractable  Insomnia due to mental condition - Plan: zolpidem (AMBIEN) 10 MG tablet   Good response to medications.  She has been stable for some times.  Depression and ADD are under control with the medications.  She does not want medicine changes. Got more irritable without Wellbutrin so restarted it.  Migraines are manageable without severity anymore.  Migraines are less severe.  No med changes today again.  Disc Vyvanse problems with [employer].  Disc alternative of Concerta if this is a problem.   Was a cautioned about the risk of Ambien causing amnesia.  She is aware and is used using the lowest effective dose.  Ambien helped.   Option Ambien CR.  Supportive therapy dealing with working from home and not letting herself develop phobia of driving.  Discussed potential benefits, risks, and side effects of stimulants with patient to include increased heart rate, palpitations, insomnia, increased anxiety, increased irritability, or decreased appetite.  Instructed patient to contact office if experiencing any significant tolerability issues.  Follow-up 6 months or sooner  Meredith Staggers, MD, DFAPA  Please see After Visit Summary for patient specific instructions.  No future appointments.  No orders of the defined types were placed in this encounter.      -------------------------------

## 2021-07-22 IMAGING — MG DIGITAL SCREENING BILAT W/ TOMO W/ CAD
8 series · 8 of 24 positions shown · non-contrast
Comparison: Previous exam(s).

CLINICAL DATA: Screening.

EXAM:
DIGITAL SCREENING BILATERAL MAMMOGRAM WITH TOMO AND CAD

[L MLO synth-2D]
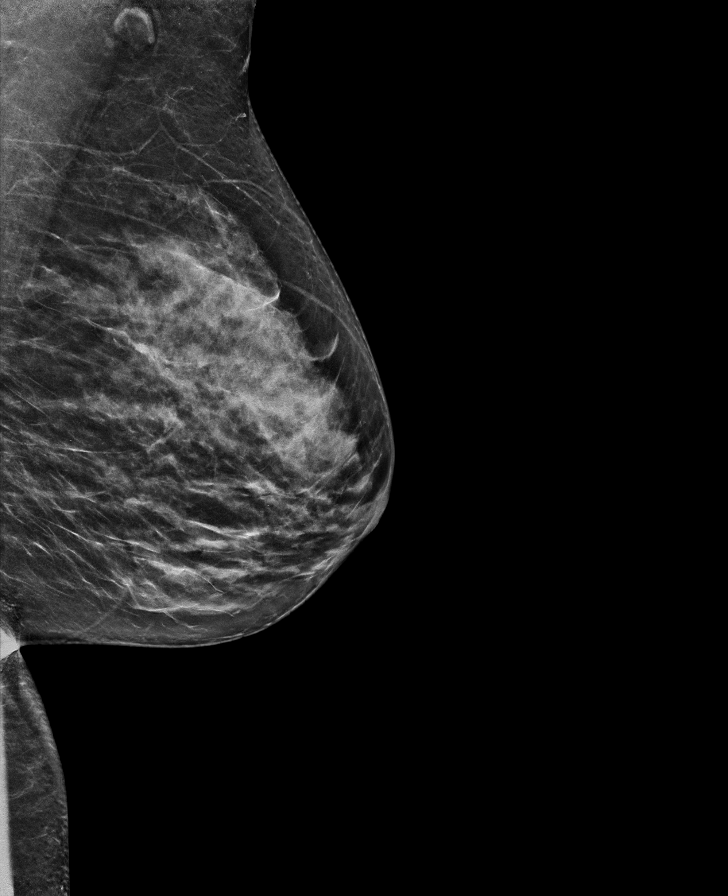

[R CC synth-2D]
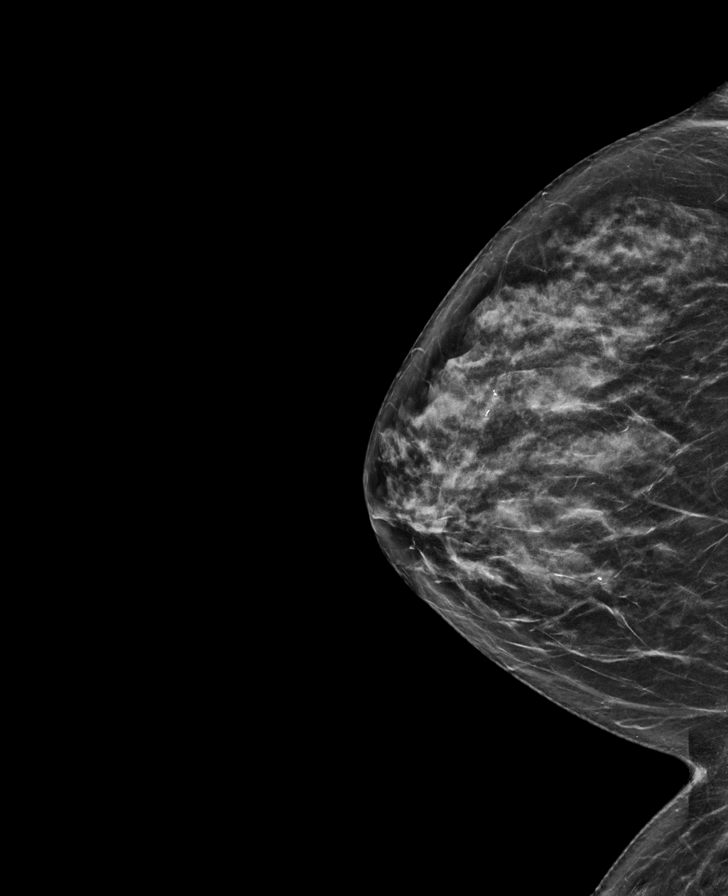

[L CC synth-2D]
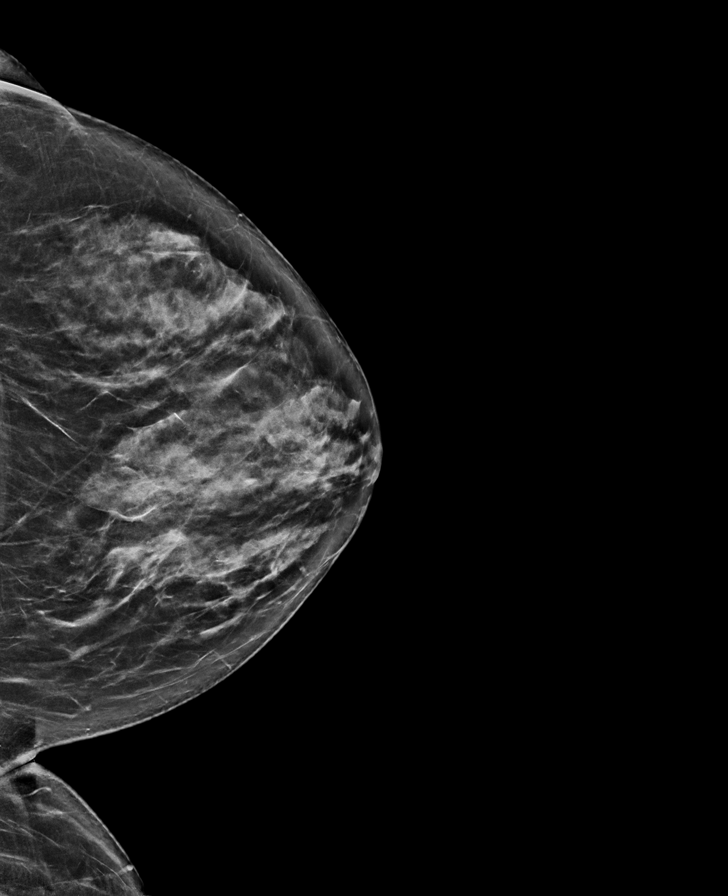

[R MLO synth-2D]
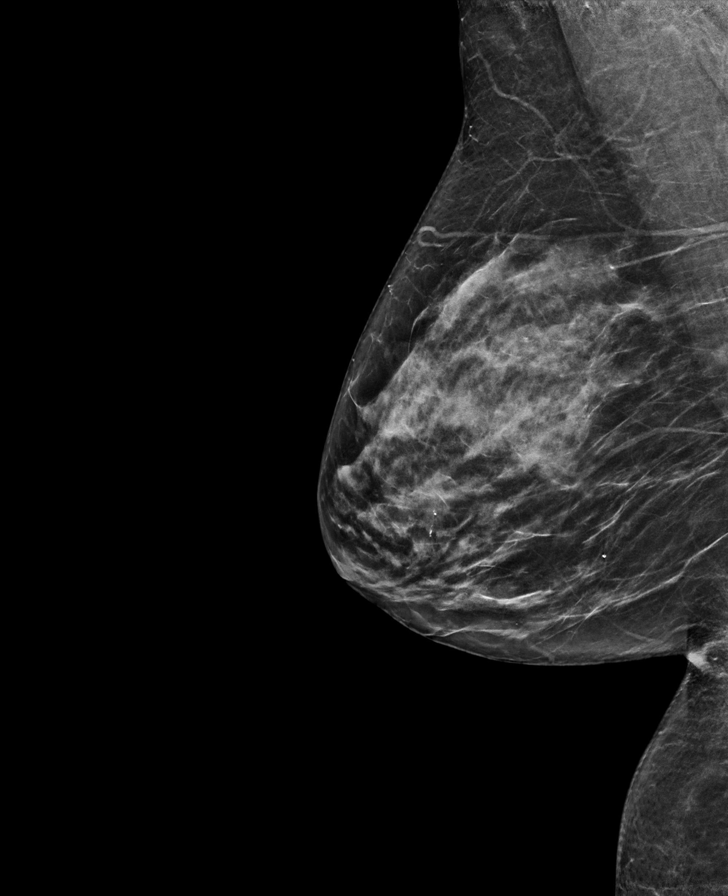

[R CC tomo · tomo slice 31/61.0]
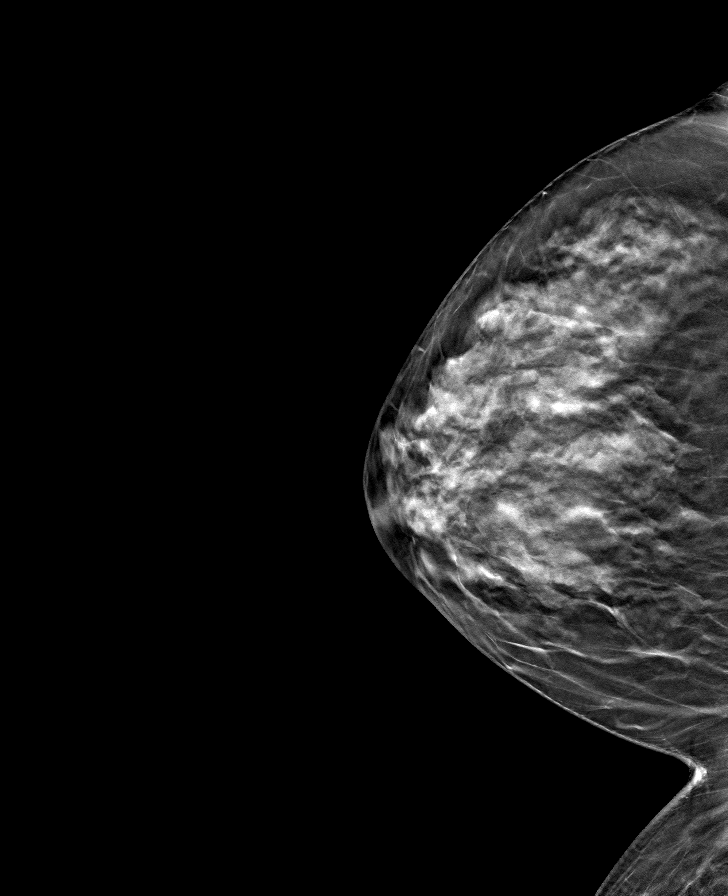

[L MLO tomo · tomo slice 33/66.0]
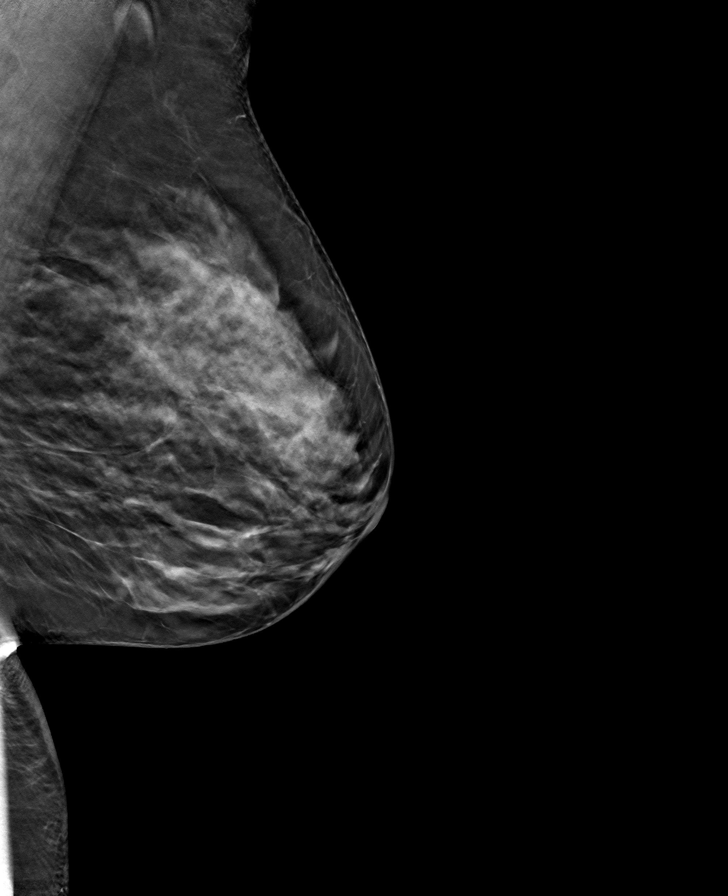

[L CC tomo · tomo slice 34/67.0]
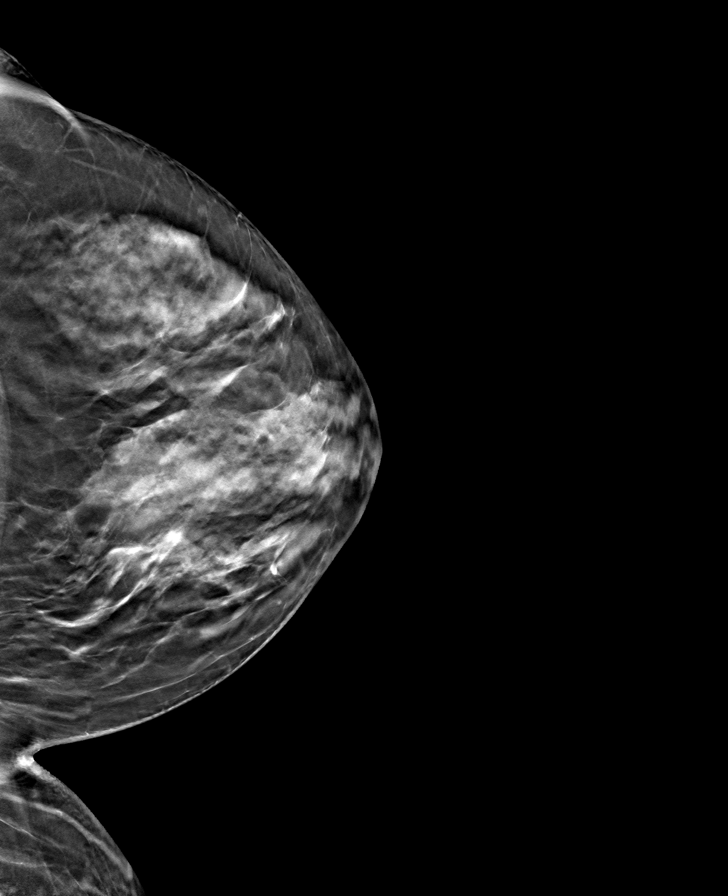

[R MLO tomo · tomo slice 34/67.0]
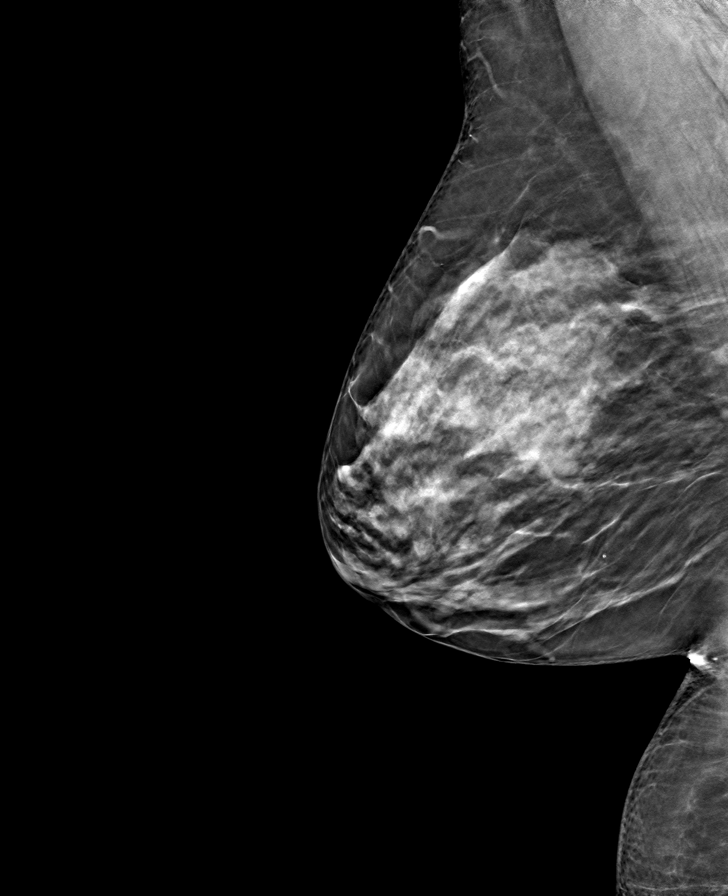

[8 of 24 positions shown; findings below may reference images not displayed]

ACR Breast Density Category c: The breast tissue is heterogeneously
dense, which may obscure small masses.
FINDINGS: There are no findings suspicious for malignancy. Images were
processed with CAD.
IMPRESSION: No mammographic evidence of malignancy. A result letter of this
screening mammogram will be mailed directly to the patient.

RECOMMENDATION:
Screening mammogram in one year. (Code:FT-U-LHB)

BI-RADS CATEGORY  1: Negative.

## 2021-07-28 ENCOUNTER — Other Ambulatory Visit: Payer: Self-pay

## 2021-07-28 ENCOUNTER — Telehealth: Payer: Self-pay | Admitting: Psychiatry

## 2021-07-28 DIAGNOSIS — F900 Attention-deficit hyperactivity disorder, predominantly inattentive type: Secondary | ICD-10-CM

## 2021-07-28 MED ORDER — LISDEXAMFETAMINE DIMESYLATE 70 MG PO CAPS: 70.0000 mg | ORAL_CAPSULE | ORAL | 0 refills | Status: DC

## 2021-07-28 NOTE — Telephone Encounter (Signed)
Pt requested refill of Vyvanse to [facility].  Next appt 9/12

## 2021-07-28 NOTE — Telephone Encounter (Signed)
Pended.

## 2021-10-12 ENCOUNTER — Telehealth: Payer: BC Managed Care – PPO | Admitting: Psychiatry

## 2021-10-20 ENCOUNTER — Telehealth: Payer: Self-pay | Admitting: Psychiatry

## 2021-10-20 ENCOUNTER — Other Ambulatory Visit: Payer: Self-pay

## 2021-10-20 DIAGNOSIS — F900 Attention-deficit hyperactivity disorder, predominantly inattentive type: Secondary | ICD-10-CM

## 2021-10-20 MED ORDER — LISDEXAMFETAMINE DIMESYLATE 70 MG PO CAPS: 70.0000 mg | ORAL_CAPSULE | ORAL | 0 refills | Status: DC

## 2021-10-20 NOTE — Telephone Encounter (Signed)
Pended.

## 2021-10-20 NOTE — Telephone Encounter (Signed)
Pt requesting RF Vyvanse 70 mg 90 day @ [facility] order. Apt 11/2

## 2021-11-06 ENCOUNTER — Other Ambulatory Visit: Payer: Self-pay | Admitting: Psychiatry

## 2021-11-06 DIAGNOSIS — F5105 Insomnia due to other mental disorder: Secondary | ICD-10-CM

## 2021-11-10 NOTE — Telephone Encounter (Signed)
Last filled 7/28 for 90 days. Due 10/24 but using mail order. LV 3/2 with F/U in 6 mo. Has appt 11/2.

## 2021-12-02 ENCOUNTER — Encounter: Payer: Self-pay | Admitting: Psychiatry

## 2021-12-02 ENCOUNTER — Telehealth (INDEPENDENT_AMBULATORY_CARE_PROVIDER_SITE_OTHER): Payer: BC Managed Care – PPO | Admitting: Psychiatry

## 2021-12-02 DIAGNOSIS — F5105 Insomnia due to other mental disorder: Secondary | ICD-10-CM

## 2021-12-02 DIAGNOSIS — G43009 Migraine without aura, not intractable, without status migrainosus: Secondary | ICD-10-CM | POA: Diagnosis not present

## 2021-12-02 DIAGNOSIS — F900 Attention-deficit hyperactivity disorder, predominantly inattentive type: Secondary | ICD-10-CM | POA: Diagnosis not present

## 2021-12-02 DIAGNOSIS — F3289 Other specified depressive episodes: Secondary | ICD-10-CM

## 2021-12-02 MED ORDER — LISDEXAMFETAMINE DIMESYLATE 70 MG PO CAPS: 70.0000 mg | ORAL_CAPSULE | ORAL | 0 refills | Status: DC

## 2021-12-02 MED ORDER — ZOLPIDEM TARTRATE 10 MG PO TABS: 10.0000 mg | ORAL_TABLET | Freq: Every day | ORAL | 1 refills | Status: DC

## 2021-12-02 MED ORDER — BUPROPION HCL ER (XL) 300 MG PO TB24: 300.0000 mg | ORAL_TABLET | Freq: Every morning | ORAL | 3 refills | Status: DC

## 2021-12-02 NOTE — Progress Notes (Signed)
Joy Turner [id number] [birth year] 56 y.o.  Video Visit via My Chart  I connected with pt by My Chart and verified that I am speaking with the correct person using two identifiers.   I discussed the limitations, risks, security and privacy concerns of performing an evaluation and management service by My Chart  and the availability of in person appointments. I also discussed with the patient that there may be a patient responsible charge related to this service. The patient expressed understanding and agreed to proceed.  I discussed the assessment and treatment plan with the patient. The patient was provided an opportunity to ask questions and all were answered. The patient agreed with the plan and demonstrated an understanding of the instructions.   The patient was advised to call back or seek an in-person evaluation if the symptoms worsen or if the condition fails to improve as anticipated.  I provided 30 minutes of video time during this encounter.  The patient was located at home and the provider was located office. Session 200-230  Subjective:   Patient ID:  Joy Turner is a 56 y.o. (DOB Aug 07, 1965) female.  Chief Complaint:  Chief Complaint  Patient presents with   Follow-up   ADHD   Depression    Depression        Associated symptoms include headaches.  Associated symptoms include no decreased concentration and no suicidal ideas. Medication Refill Associated symptoms include headaches and neck pain.   [name] presents to the office today for follow-up of atypical depression and ADD.  seen August, 2020.  No meds were changed.  She has been stable on the same medication for quite some period of time.   10/2019 appt noted: She's remained well.  Not much change bc work from home anyway.  H now works from home too for [employer].  Doing well with the meds and no new concerns.  Focus still good with Vyvanse.  Pleased. Productive.  Work is OK from home.  Went off  Wellbutrin for 4 weeks DT late in the mail and decided to stop it.  Got more depressed and irritable without it.  Stayed with work from home and away from [employer].  Not using migraine meds bc nausea and cant get Relpax.  They are milder now..  Usually last less than a day.  A few times monthly. Takes OTC.  Not as bad as in the past.  Some tension HA.  Disc Mackenzie exercises.  04/13/2020 appointment with following noted: Still able to get Vyvanse but hasn't filled it this year.    Still pleased with this. Still pleased with the meds.  No SE. She works for [employer]. Mood is steady on Wellbutrin and maintained benefit with less irritability vs off it.  In past had problem with napping and drowsiness and no problem with it now.   Plan no med changes  10/15/20 appt noted: Less traditional migraine. Had wellness exam.  Had lost 30# over the last year cutting carbs and snacks.  H lost weight too.  No longer obese.  Works [employer]. Adequate duration with Vyvanse.   Satisfied with meds.  No SE. Normal BP and pulse Plan no changes  04/01/21 appt noted: Still on same meds.   Doing well overall. No sig depression.   No SE. Working from home.   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  Vacation tomorrow.   Continues Wellbutrin , vyvanse  70, Ambien 10 HS Still getting meds considtently and no SE.     No sig migraine anymore.   Works 10 hours/ 4 day per week. Good, the same as last visit.  Patient reports stable mood and denies depressed or irritable moods.  Patient denies any recent difficulty with anxiety.  Patient denies difficulty with sleep initiation and 6-7 hours with Ambien. Mild awakening usually with pain.Marland Kitchenotherwise satisfied with Zolpidem but may have some awakening.  Consistent with routine. 6-7 hours per night. Some nocturia.  Denies appetite disturbance.  Patient reports that energy and motivation have been good.  Patient denies  any difficulty with concentration.  Patient denies any suicidal ideation.  Past Psychiatric Medication Trials: Vyvanse 70, Adderall,   Wellbutrin 300 helped, Prozac worsened depression,  Abilify,  Imitrex, Relpax, topiramate  Ambien,   Review of Systems:  Review of Systems  Musculoskeletal:  Positive for neck pain.  Neurological:  Positive for headaches. Negative for dizziness and tremors.  Psychiatric/Behavioral:  Negative for agitation, behavioral problems, confusion, decreased concentration, dysphoric mood, hallucinations, self-injury, sleep disturbance and suicidal ideas. The patient is not nervous/anxious and is not hyperactive.     Medications: I have reviewed the patient's current medications.  Current Outpatient Medications  Medication Sig Dispense Refill   buPROPion (WELLBUTRIN XL) 300 MG 24 hr tablet Take 1 tablet (300 mg total) by mouth every morning. 90 tablet 1   etonogestrel-ethinyl estradiol (NUVARING) 0.12-0.015 MG/24HR vaginal ring      lisdexamfetamine (VYVANSE) 70 MG capsule Take 1 capsule (70 mg total) by mouth every morning. 90 capsule 0   zolpidem (AMBIEN) 10 MG tablet TAKE 1 TABLET BY MOUTH AT  BEDTIME 90 tablet 0   No current facility-administered medications for this visit.    Medication Side Effects: None except with migraine meds.  Allergies:  Allergies  Allergen Reactions   Penicillins Rash    rash    Past Medical History:  Diagnosis Date   ADHD    Depression    Insomnia    Migraines     Family History  Problem Relation Age of Onset   Breast cancer Paternal Aunt     Social History   Socioeconomic History   Marital status: Married    Spouse name: Not on file   Number of children: Not on file   Years of education: Not on file   Highest education level: Not on file  Occupational History   Not on file  Tobacco Use   Smoking status: Former   Smokeless tobacco: Never  Vaping Use   Vaping Use: Never used  Substance and Sexual  Activity   Alcohol use: Yes    Comment: rarely   Drug use: Never   Sexual activity: Yes    Birth control/protection: Inserts  Other Topics Concern   Not on file  Social History Narrative   Not on file   Social Determinants of Health   Financial Resource Strain: Not on file  Food Insecurity: Not on file  Transportation Needs: Not on file  Physical Activity: Not on file  Stress: Not on file  Social Connections: Not on file  Intimate Partner Violence: Not on file    Past Medical History, Surgical history, Social history, and Family history were reviewed and updated as appropriate.   Please see review of systems for further details on the patient's review from today.   Objective:   Physical Exam:  There were no vitals taken for this visit.  Physical Exam Neurological:  Mental Status: She is alert and oriented to person, place, and time.     Cranial Nerves: No dysarthria.  Psychiatric:        Attention and Perception: Attention and perception normal.        Mood and Affect: Mood normal. Mood is not anxious.        Speech: Speech normal. Speech is not slurred.        Behavior: Behavior is cooperative.        Thought Content: Thought content normal. Thought content is not paranoid or delusional. Thought content does not include homicidal or suicidal ideation. Thought content does not include suicidal plan.        Cognition and Memory: Cognition and memory normal.        Judgment: Judgment normal.     Comments: Insight intact     Lab Review:  No results found for: "NA", "K", "CL", "CO2", "GLUCOSE", "BUN", "CREATININE", "CALCIUM", "PROT", "ALBUMIN", "AST", "ALT", "ALKPHOS", "BILITOT", "GFRNONAA", "GFRAA"  No results found for: "WBC", "RBC", "HGB", "HCT", "PLT", "MCV", "MCH", "MCHC", "RDW", "LYMPHSABS", "MONOABS", "EOSABS", "BASOSABS"  No results found for: "POCLITH", "LITHIUM"   No results found for: "PHENYTOIN", "PHENOBARB", "VALPROATE", "CBMZ"   .res Assessment:  Plan:    Atypical depression  Attention deficit hyperactivity disorder (ADHD), predominantly inattentive type  Migraine without aura and without status migrainosus, not intractable  Insomnia due to mental condition   Good response to medications.  She has been stable for some times.  Depression and ADD are under control with the medications.  She does not want medicine changes. Got more irritable without Wellbutrin so restarted it.  Migraines are manageable without severity anymore.  Migraines are less severe.  No med changes today again.  Disc Vyvanse transition to generic likely after the new year. Disc   Was a cautioned about the risk of Ambien causing amnesia.  She is aware and is used using the lowest effective dose.  Ambien helped.   Option Ambien CR.  Supportive therapy dealing with working from home and not letting herself develop phobia of driving. Disc using standing desk bc long hours at desk.    Discussed potential benefits, risks, and side effects of stimulants with patient to include increased heart rate, palpitations, insomnia, increased anxiety, increased irritability, or decreased appetite.  Instructed patient to contact office if experiencing any significant tolerability issues.  Continue Wellbutrin XL 300 mg AM, vyvanse 70 mg and Ambien 5-10 mg HS  Follow-up 9-12 mos bc stable a long time  Lynder Parents, MD, DFAPA  Please see After Visit Summary for patient specific instructions.  No future appointments.  No orders of the defined types were placed in this encounter.      -------------------------------

## 2022-01-12 ENCOUNTER — Telehealth: Payer: Self-pay | Admitting: Psychiatry

## 2022-01-12 NOTE — Telephone Encounter (Signed)
Filled 10/5 for 90 days due 02/02/22

## 2022-01-12 NOTE — Telephone Encounter (Signed)
Pt called at 2:10p.  She would like refill of Vyvanse 70mg  sent to [facility] [address] in [place].  No upcoming appt scheduled.

## 2022-01-14 ENCOUNTER — Other Ambulatory Visit: Payer: Self-pay | Admitting: Psychiatry

## 2022-01-14 MED ORDER — LISDEXAMFETAMINE DIMESYLATE 70 MG PO CAPS: 70.0000 mg | ORAL_CAPSULE | Freq: Every day | ORAL | 0 refills | Status: DC

## 2022-01-14 NOTE — Telephone Encounter (Signed)
Patient said she has misplaced some of her Vyvanse. She last filled 10/5 for 90 days and is due 1/3 via mail order. She is asking for 2-week supply to local pharmacy and knows that insurance will not cover. Note said that you had approved, but I had not told her that.

## 2022-01-14 NOTE — Telephone Encounter (Signed)
Joy Turner called at 10:20 to check status of refill of her Vyvanse.  She said she previously talked with the nurse on the need for just a 14 day supply that she will have to pay out of pocket for.  She only has 3 pills left.  She is unsure why she is short.  She is thinking perhaps she left some at the hotel where they went on vacation. Mail order will send her next prescription 12/18, but it takes about 10 days to received it, so she requested just a 14 day supply to tie her over.  She said the nurse told her Dr. Jennelle Human approved the refill and she called with the name of the [facility] to [facility] to [facility] [address] [place]  Can this prescription please be sent in?  Please let her know.

## 2022-01-14 NOTE — Telephone Encounter (Signed)
Sent Rx vyvanse #14

## 2022-04-22 ENCOUNTER — Telehealth: Payer: Self-pay | Admitting: Psychiatry

## 2022-04-22 ENCOUNTER — Other Ambulatory Visit: Payer: Self-pay | Admitting: Psychiatry

## 2022-04-22 MED ORDER — LISDEXAMFETAMINE DIMESYLATE 70 MG PO CAPS
70.0000 mg | ORAL_CAPSULE | Freq: Every day | ORAL | 0 refills | Status: AC
Start: 2022-04-22 — End: ?

## 2022-04-22 NOTE — Telephone Encounter (Addendum)
Pt requesting Rx for Vyvanse  #90 to [facility] mail order. Follow up due August 2024

## 2022-05-04 ENCOUNTER — Telehealth: Payer: Self-pay | Admitting: Psychiatry

## 2022-05-04 ENCOUNTER — Other Ambulatory Visit: Payer: Self-pay

## 2022-05-04 DIAGNOSIS — F900 Attention-deficit hyperactivity disorder, predominantly inattentive type: Secondary | ICD-10-CM

## 2022-05-04 NOTE — Telephone Encounter (Signed)
I signed 85 D RX last week

## 2022-05-04 NOTE — Telephone Encounter (Signed)
Pended.

## 2022-05-04 NOTE — Telephone Encounter (Signed)
Requesting a refill on Vyvanse 70 mg called to:  [id number] [place] [place]    Phone: [phone]  Fax: [phone]   It is in stock

## 2022-07-04 ENCOUNTER — Other Ambulatory Visit: Payer: Self-pay | Admitting: Psychiatry

## 2022-07-04 DIAGNOSIS — F5105 Insomnia due to other mental disorder: Secondary | ICD-10-CM

## 2022-07-18 ENCOUNTER — Telehealth: Payer: Self-pay | Admitting: Psychiatry

## 2022-07-18 ENCOUNTER — Other Ambulatory Visit: Payer: Self-pay | Admitting: Psychiatry

## 2022-07-18 DIAGNOSIS — F900 Attention-deficit hyperactivity disorder, predominantly inattentive type: Secondary | ICD-10-CM

## 2022-07-18 MED ORDER — LISDEXAMFETAMINE DIMESYLATE 70 MG PO CAPS
70.0000 mg | ORAL_CAPSULE | ORAL | 0 refills | Status: DC
Start: 2022-07-18 — End: 2022-10-10

## 2022-07-18 NOTE — Telephone Encounter (Signed)
Joy Turner called at 9:30 to request refill of her vyvanse 70mg .  Appt 11/6.  Send to [facility]) - [place], [place] - [address] [place]

## 2022-10-06 ENCOUNTER — Other Ambulatory Visit: Payer: Self-pay | Admitting: Psychiatry

## 2022-10-06 DIAGNOSIS — F3289 Other specified depressive episodes: Secondary | ICD-10-CM

## 2022-10-10 ENCOUNTER — Telehealth: Payer: Self-pay | Admitting: Psychiatry

## 2022-10-10 ENCOUNTER — Other Ambulatory Visit: Payer: Self-pay

## 2022-10-10 DIAGNOSIS — F900 Attention-deficit hyperactivity disorder, predominantly inattentive type: Secondary | ICD-10-CM

## 2022-10-10 NOTE — Telephone Encounter (Signed)
Pended.

## 2022-10-10 NOTE — Telephone Encounter (Signed)
Patient lvm at 10:50 requesting refill for Vyvanse 70mg . Ph: [phone] Appt 11/6 Pharmacy [facility] [address] [place], [place]

## 2022-10-11 MED ORDER — LISDEXAMFETAMINE DIMESYLATE 70 MG PO CAPS: 70.0000 mg | ORAL_CAPSULE | ORAL | 0 refills | Status: DC

## 2022-12-07 ENCOUNTER — Ambulatory Visit: Payer: BC Managed Care – PPO | Admitting: Psychiatry

## 2023-01-02 ENCOUNTER — Telehealth: Payer: Self-pay | Admitting: Psychiatry

## 2023-01-02 ENCOUNTER — Other Ambulatory Visit: Payer: Self-pay | Admitting: Psychiatry

## 2023-01-02 DIAGNOSIS — F5105 Insomnia due to other mental disorder: Secondary | ICD-10-CM

## 2023-01-02 DIAGNOSIS — F900 Attention-deficit hyperactivity disorder, predominantly inattentive type: Secondary | ICD-10-CM

## 2023-01-02 MED ORDER — LISDEXAMFETAMINE DIMESYLATE 70 MG PO CAPS: 70.0000 mg | ORAL_CAPSULE | ORAL | 0 refills | Status: DC

## 2023-01-02 MED ORDER — ZOLPIDEM TARTRATE 10 MG PO TABS: 10.0000 mg | ORAL_TABLET | Freq: Every day | ORAL | 0 refills | Status: DC

## 2023-01-02 NOTE — Telephone Encounter (Signed)
Joy Turner called at 11:10 to request refill of her Vyvanse.  Appt 1/7.  Send to [facility]) - [place], [place] - [address] [place]

## 2023-01-02 NOTE — Telephone Encounter (Signed)
Pended vyvanse 70 mg to requested pharmacy,.

## 2023-02-07 ENCOUNTER — Encounter: Payer: Self-pay | Admitting: Psychiatry

## 2023-02-07 ENCOUNTER — Telehealth: Payer: BC Managed Care – PPO | Admitting: Psychiatry

## 2023-02-07 DIAGNOSIS — F900 Attention-deficit hyperactivity disorder, predominantly inattentive type: Secondary | ICD-10-CM

## 2023-02-07 DIAGNOSIS — F3289 Other specified depressive episodes: Secondary | ICD-10-CM

## 2023-02-07 DIAGNOSIS — F5105 Insomnia due to other mental disorder: Secondary | ICD-10-CM

## 2023-02-07 MED ORDER — VYVANSE 70 MG PO CAPS: 70.0000 mg | ORAL_CAPSULE | Freq: Every day | ORAL | 0 refills | Status: DC

## 2023-02-07 MED ORDER — ZOLPIDEM TARTRATE 10 MG PO TABS: 10.0000 mg | ORAL_TABLET | Freq: Every day | ORAL | 1 refills | Status: DC

## 2023-02-07 MED ORDER — BUPROPION HCL ER (XL) 300 MG PO TB24: 300.0000 mg | ORAL_TABLET | Freq: Every morning | ORAL | 3 refills | Status: DC

## 2023-02-07 NOTE — Progress Notes (Signed)
 Joy Turner [id number] March 05, 1965 58 y.o.  Video Visit via My Chart  I connected with pt by My Chart and verified that I am speaking with the correct person using two identifiers.   I discussed the limitations, risks, security and privacy concerns of performing an evaluation and management service by My Chart  and the availability of in person appointments. I also discussed with the patient that there may be a patient responsible charge related to this service. The patient expressed understanding and agreed to proceed.  I discussed the assessment and treatment plan with the patient. The patient was provided an opportunity to ask questions and all were answered. The patient agreed with the plan and demonstrated an understanding of the instructions.   The patient was advised to call back or seek an in-person evaluation if the symptoms worsen or if the condition fails to improve as anticipated.  I provided 30 minutes of video time during this encounter.  The patient was located at home and the provider was located office. Session 200-230  Subjective:   Patient ID:  Joy Turner is a 58 y.o. (DOB 10-07-1965) female.  Chief Complaint:  Chief Complaint  Patient presents with   Follow-up    Depression        Associated symptoms include headaches.  Associated symptoms include no decreased concentration and no suicidal ideas. Medication Refill Associated symptoms include headaches and neck pain.   Joy Turner presents to the office today for follow-up of atypical depression and ADD.  seen August, 2020.  No meds were changed.  She has been stable on the same medication for quite some period of time.   10/2019 appt noted: She's remained well.  Not much change bc work from home anyway.  H now works from home too for [employer].  Doing well with the meds and no new concerns.  Focus still good with Vyvanse .  Pleased. Productive.  Work is OK from home.  Went off Wellbutrin  for 4 weeks DT  late in the mail and decided to stop it.  Got more depressed and irritable without it.  Stayed with work from home and away from [employer].  Not using migraine meds bc nausea and cant get Relpax.  They are milder now..  Usually last less than a day.  A few times monthly. Takes OTC.  Not as bad as in the past.  Some tension HA.  Disc Mackenzie exercises.  04/13/2020 appointment with following noted: Still able to get Vyvanse  but hasn't filled it this year.    Still pleased with this. Still pleased with the meds.  No SE. She works for [employer]. Mood is steady on Wellbutrin  and maintained benefit with less irritability vs off it.  In past had problem with napping and drowsiness and no problem with it now.   Plan no med changes  10/15/20 appt noted: Less traditional migraine. Had wellness exam.  Had lost 30# over the last year cutting carbs and snacks.  H lost weight too.  No longer obese.  Works [employer]. Adequate duration with Vyvanse .   Satisfied with meds.  No SE. Normal BP and pulse Plan no changes  04/01/21 appt noted: Still on same meds.   Doing well overall. No sig depression.   No SE. Working from home.   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  Vacation tomorrow.   Continues Wellbutrin  , vyvanse  70, Ambien  10 HS Still getting  meds considtently and no SE.     No sig migraine anymore.   Works 10 hours/ 4 day per week. Good, the same as last visit.  Patient reports stable mood and denies depressed or irritable moods.  Patient denies any recent difficulty with anxiety.  Patient denies difficulty with sleep initiation and 6-7 hours with Ambien . Mild awakening usually with pain.SABRAotherwise satisfied with Zolpidem  but may have some awakening.  Consistent with routine. 6-7 hours per night. Some nocturia.  Denies appetite disturbance.  Patient reports that energy and motivation have been good.  Patient denies any difficulty with  concentration.  Patient denies any suicidal ideation.  02/07/23 appt noted: Continues Wellbutrin  , vyvanse  70, Ambien  10 HS Had URI. No SE.   Prefers brand Vyvanse .  But had some problems Mood is good.  Season  tix Joy Turner. Enjoys the game.   No new concerns.    Past Psychiatric Medication Trials:  Vyvanse  70,  Adderall hard to remember,  Adderall XR too short Wellbutrin  300 helped, Prozac worsened depression,  Abilify,  Imitrex, Relpax, topiramate  Ambien ,   Review of Systems:  Review of Systems  Musculoskeletal:  Positive for neck pain.  Neurological:  Positive for headaches. Negative for dizziness and tremors.  Psychiatric/Behavioral:  Negative for agitation, behavioral problems, confusion, decreased concentration, dysphoric mood, hallucinations, self-injury, sleep disturbance and suicidal ideas. The patient is not nervous/anxious and is not hyperactive.     Medications: I have reviewed the patient's current medications.  Current Outpatient Medications  Medication Sig Dispense Refill   buPROPion  (WELLBUTRIN  XL) 300 MG 24 hr tablet TAKE 1 TABLET BY MOUTH EVERY  MORNING 90 tablet 3   etonogestrel-ethinyl estradiol (NUVARING) 0.12-0.015 MG/24HR vaginal ring      lisdexamfetamine (VYVANSE ) 70 MG capsule Take 1 capsule (70 mg total) by mouth daily. 90 capsule 0   lisdexamfetamine (VYVANSE ) 70 MG capsule Take 1 capsule (70 mg total) by mouth every morning. 90 capsule 0   zolpidem  (AMBIEN ) 10 MG tablet Take 1 tablet (10 mg total) by mouth at bedtime. 90 tablet 0   No current facility-administered medications for this visit.    Medication Side Effects: None except with migraine meds.  Allergies:  Allergies  Allergen Reactions   Penicillins Rash    rash    Past Medical History:  Diagnosis Date   ADHD    Depression    Insomnia    Migraines     Family History  Problem Relation Age of Onset   Breast cancer Paternal Aunt     Social History   Socioeconomic  History   Marital status: Married    Spouse name: Not on file   Number of children: Not on file   Years of education: Not on file   Highest education level: Not on file  Occupational History   Not on file  Tobacco Use   Smoking status: Former   Smokeless tobacco: Never  Vaping Use   Vaping status: Never Used  Substance and Sexual Activity   Alcohol use: Yes    Comment: rarely   Drug use: Never   Sexual activity: Yes    Birth control/protection: Inserts  Other Topics Concern   Not on file  Social History Narrative   Not on file   Social Drivers of Health   Financial Resource Strain: Not on file  Food Insecurity: Not on file  Transportation Needs: Not on file  Physical Activity: Not on file  Stress: Not on file  Social Connections:  Not on file  Intimate Partner Violence: Not on file    Past Medical History, Surgical history, Social history, and Family history were reviewed and updated as appropriate.   Please see review of systems for further details on the patient's review from today.   Objective:   Physical Exam:  There were no vitals taken for this visit.  Physical Exam Neurological:     Mental Status: She is alert and oriented to person, place, and time.     Cranial Nerves: No dysarthria.  Psychiatric:        Attention and Perception: Attention and perception normal.        Mood and Affect: Mood normal. Mood is not anxious.        Speech: Speech normal. Speech is not slurred.        Behavior: Behavior is cooperative.        Thought Content: Thought content normal. Thought content is not paranoid or delusional. Thought content does not include homicidal or suicidal ideation. Thought content does not include suicidal plan.        Cognition and Memory: Cognition and memory normal.        Judgment: Judgment normal.     Comments: Insight intact     Lab Review:  No results found for: NA, K, CL, CO2, GLUCOSE, BUN, CREATININE, CALCIUM, PROT,  ALBUMIN, AST, ALT, ALKPHOS, BILITOT, GFRNONAA, GFRAA  No results found for: WBC, RBC, HGB, HCT, PLT, MCV, MCH, MCHC, RDW, LYMPHSABS, MONOABS, EOSABS, BASOSABS  No results found for: POCLITH, LITHIUM   No results found for: PHENYTOIN, PHENOBARB, VALPROATE, CBMZ   .res Assessment: Plan:    Attention deficit hyperactivity disorder (ADHD), predominantly inattentive type  Insomnia due to mental condition  Atypical depression   Good response to medications.  She has been stable for some times.  Depression and ADD are under control with the medications.  She does not want medicine changes. Got more irritable without Wellbutrin  so restarted it.  Migraines are manageable without severity anymore.  Migraines are less severe.  No med changes today again.  Disc Vyvanse  transition to generic likely after the new year.   Was a cautioned about the risk of Ambien  causing amnesia.  She is aware and using the lowest effective dose.  Ambien  helped.   Option Ambien  CR.  Supportive therapy dealing with working from home and not letting herself develop phobia of driving. Disc using standing desk bc long hours at desk.    Discussed potential benefits, risks, and side effects of stimulants with patient to include increased heart rate, palpitations, insomnia, increased anxiety, increased irritability, or decreased appetite.  Instructed patient to contact office if experiencing any significant tolerability issues.  Continue Wellbutrin  XL 300 mg AM, vyvanse  70 mg and Ambien  5-10 mg HS  Follow-up 9-12 mos bc stable a long time  Lorene Macintosh, MD, DFAPA  Please see After Visit Summary for patient specific instructions.  No future appointments.  No orders of the defined types were placed in this encounter.      -------------------------------

## 2023-07-05 ENCOUNTER — Other Ambulatory Visit: Payer: Self-pay

## 2023-07-05 ENCOUNTER — Telehealth: Payer: Self-pay | Admitting: Psychiatry

## 2023-07-05 DIAGNOSIS — F900 Attention-deficit hyperactivity disorder, predominantly inattentive type: Secondary | ICD-10-CM

## 2023-07-05 MED ORDER — LISDEXAMFETAMINE DIMESYLATE 70 MG PO CAPS: 70.0000 mg | ORAL_CAPSULE | ORAL | 0 refills | Status: DC

## 2023-07-05 NOTE — Telephone Encounter (Signed)
 Pt called at 9:40a, requesting refill of Vyvanse  to   [facility]) - [place], [place] - [address] [facility] [facility] [address] [place] [address], [place] [address] Phone: [phone]  Fax: [phone]    Next appt 1/14

## 2023-07-05 NOTE — Telephone Encounter (Signed)
 Pended Vyvanse  70 mg, 90-day supply to [facility].

## 2023-10-10 ENCOUNTER — Telehealth: Payer: Self-pay | Admitting: Psychiatry

## 2023-10-10 ENCOUNTER — Other Ambulatory Visit: Payer: Self-pay | Admitting: Psychiatry

## 2023-10-10 DIAGNOSIS — F5105 Insomnia due to other mental disorder: Secondary | ICD-10-CM

## 2023-10-10 DIAGNOSIS — F900 Attention-deficit hyperactivity disorder, predominantly inattentive type: Secondary | ICD-10-CM

## 2023-10-10 MED ORDER — LISDEXAMFETAMINE DIMESYLATE 70 MG PO CAPS: 70.0000 mg | ORAL_CAPSULE | ORAL | 0 refills | Status: DC

## 2023-10-10 NOTE — Telephone Encounter (Signed)
 Pt called asking for a refill on her vyvanse  70 mg. Pharmacy is [facility]. Next appt is 02/14/24

## 2023-10-10 NOTE — Telephone Encounter (Signed)
 Pended

## 2024-01-01 ENCOUNTER — Other Ambulatory Visit: Payer: Self-pay | Admitting: Psychiatry

## 2024-01-01 ENCOUNTER — Telehealth: Payer: Self-pay | Admitting: Psychiatry

## 2024-01-01 DIAGNOSIS — F900 Attention-deficit hyperactivity disorder, predominantly inattentive type: Secondary | ICD-10-CM

## 2024-01-01 DIAGNOSIS — F5105 Insomnia due to other mental disorder: Secondary | ICD-10-CM

## 2024-01-01 NOTE — Telephone Encounter (Signed)
 Pt lvm needing vyvanse  rf   [facility]

## 2024-01-01 NOTE — Telephone Encounter (Addendum)
 LF 9/10 x 90 days

## 2024-01-01 NOTE — Telephone Encounter (Signed)
 Pended

## 2024-01-02 MED ORDER — LISDEXAMFETAMINE DIMESYLATE 70 MG PO CAPS: 70.0000 mg | ORAL_CAPSULE | ORAL | 0 refills | Status: AC

## 2024-01-30 ENCOUNTER — Other Ambulatory Visit: Payer: Self-pay | Admitting: Psychiatry

## 2024-01-30 DIAGNOSIS — F3289 Other specified depressive episodes: Secondary | ICD-10-CM

## 2024-02-14 ENCOUNTER — Encounter: Payer: Self-pay | Admitting: Psychiatry

## 2024-02-14 ENCOUNTER — Telehealth: Payer: Self-pay | Admitting: Psychiatry

## 2024-02-14 DIAGNOSIS — F5105 Insomnia due to other mental disorder: Secondary | ICD-10-CM | POA: Diagnosis not present

## 2024-02-14 DIAGNOSIS — F3289 Other specified depressive episodes: Secondary | ICD-10-CM | POA: Diagnosis not present

## 2024-02-14 DIAGNOSIS — F900 Attention-deficit hyperactivity disorder, predominantly inattentive type: Secondary | ICD-10-CM

## 2024-02-14 MED ORDER — BUPROPION HCL ER (XL) 300 MG PO TB24: 300.0000 mg | ORAL_TABLET | Freq: Every morning | ORAL | 3 refills | Status: AC

## 2024-02-14 MED ORDER — VYVANSE 70 MG PO CAPS: 70.0000 mg | ORAL_CAPSULE | Freq: Every day | ORAL | 0 refills | Status: AC

## 2024-02-14 NOTE — Progress Notes (Signed)
 Joy Turner [id number] 05-26-1965 59 y.o.  Video Visit via My Chart  I connected with pt by My Chart and verified that I am speaking with the correct person using two identifiers.   I discussed the limitations, risks, security and privacy concerns of performing an evaluation and management service by My Chart  and the availability of in person appointments. I also discussed with the patient that there may be a patient responsible charge related to this service. The patient expressed understanding and agreed to proceed.  I discussed the assessment and treatment plan with the patient. The patient was provided an opportunity to ask questions and all were answered. The patient agreed with the plan and demonstrated an understanding of the instructions.   The patient was advised to call back or seek an in-person evaluation if the symptoms worsen or if the condition fails to improve as anticipated.  I provided 30 minutes of video time during this encounter.  The patient was located at home and the provider was located office. Session [id number]  Subjective:   Patient ID:  Joy Turner is a 59 y.o. (DOB 01-20-1966) female.  Chief Complaint:  Chief Complaint  Patient presents with   Follow-up   ADD    Joy Turner presents to the office today for follow-up of atypical depression and ADD.  seen August, 2020.  No meds were changed.  She has been stable on the same medication for quite some period of time.   10/2019 appt noted: She's remained well.  Not much change bc work from home anyway.  H now works from home too for [employer].  Doing well with the meds and no new concerns.  Focus still good with Vyvanse .  Pleased. Productive.  Work is OK from home.  Went off Wellbutrin  for 4 weeks DT late in the mail and decided to stop it.  Got more depressed and irritable without it.  Stayed with work from home and away from [employer].  Not using migraine meds bc nausea and cant get Relpax.  They are  milder now..  Usually last less than a day.  A few times monthly. Takes OTC.  Not as bad as in the past.  Some tension HA.  Disc [employer] exercises.  04/13/2020 appointment with following noted: Still able to get Vyvanse  but hasn't filled it this year.    Still pleased with this. Still pleased with the meds.  No SE. She works for [employer]. Mood is steady on Wellbutrin  and maintained benefit with less irritability vs off it.  In past had problem with napping and drowsiness and no problem with it now.   Plan no med changes  10/15/20 appt noted: Less traditional migraine. Had wellness exam.  Had lost 30# over the last year cutting carbs and snacks.  H lost weight too.  No longer obese.  Works [employer]. Adequate duration with Vyvanse .   Satisfied with meds.  No SE. Normal BP and pulse Plan no changes  04/01/21 appt noted: Still on same meds.   Doing well overall. No sig depression.   No SE. Working from home.   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  Vacation tomorrow.   Continues Wellbutrin  , vyvanse  70, Ambien  10 HS Still getting meds considtently and no SE.     No sig migraine anymore.   Works 10 hours/ 4 day per week. Good, the same as last visit.  Patient reports  stable mood and denies depressed or irritable moods.  Patient denies any recent difficulty with anxiety.  Patient denies difficulty with sleep initiation and 6-7 hours with Ambien . Mild awakening usually with pain.SABRAotherwise satisfied with Zolpidem  but may have some awakening.  Consistent with routine. 6-7 hours per night. Some nocturia.  Denies appetite disturbance.  Patient reports that energy and motivation have been good.  Patient denies any difficulty with concentration.  Patient denies any suicidal ideation.  02/07/23 appt noted: Continues Wellbutrin  , vyvanse  70, Ambien  10 HS Had URI. No SE.   Prefers brand Vyvanse .  But had some problems Mood is good.   [employer]. Enjoys the game.     02/14/24 appt noted: Med:  Wellbutrin  XL 300 , vyvanse  70, Ambien  10 HS No SE H retiring this year.  Goes to [employer]. Enjoying it.  No new concerns.   Patient reports stable mood and denies depressed or irritable moods.  Patient denies any recent difficulty with anxiety.  Patient denies difficulty with sleep initiation or maintenance. Denies appetite disturbance.  Patient reports that energy and motivation have been good.  Patient denies any difficulty with concentration.  Patient denies any suicidal ideation. HA managed.   No sig migraine frequency.   No new concerns.   Past Psychiatric Medication Trials:  Vyvanse  70,  Adderall hard to remember,  Adderall XR too short Wellbutrin  300 helped, Prozac worsened depression,  Abilify,  Imitrex, Relpax, topiramate  Ambien ,   Review of Systems:  Review of Systems  Musculoskeletal:  Positive for neck pain.  Neurological:  Positive for headaches. Negative for dizziness, tremors and weakness.  Psychiatric/Behavioral:  Negative for agitation, behavioral problems, confusion, decreased concentration, dysphoric mood, hallucinations, self-injury, sleep disturbance and suicidal ideas. The patient is not nervous/anxious and is not hyperactive.     Medications: I have reviewed the patient's current medications.  Current Outpatient Medications  Medication Sig Dispense Refill   etonogestrel-ethinyl estradiol (NUVARING) 0.12-0.015 MG/24HR vaginal ring      lisdexamfetamine  (VYVANSE ) 70 MG capsule Take 1 capsule (70 mg total) by mouth daily. 90 capsule 0   lisdexamfetamine  (VYVANSE ) 70 MG capsule Take 1 capsule (70 mg total) by mouth every morning. 90 capsule 0   zolpidem  (AMBIEN ) 10 MG tablet TAKE 1 TABLET BY MOUTH AT  BEDTIME 90 tablet 0   buPROPion  (WELLBUTRIN  XL) 300 MG 24 hr tablet Take 1 tablet (300 mg total) by mouth every morning. 90 tablet 3   VYVANSE  70 MG capsule Take 1 capsule  (70 mg total) by mouth daily. 90 capsule 0   No current facility-administered medications for this visit.    Medication Side Effects: None except with migraine meds.  Allergies:  Allergies  Allergen Reactions   Penicillins Rash    rash    Past Medical History:  Diagnosis Date   ADHD    Depression    Insomnia    Migraines     Family History  Problem Relation Age of Onset   Breast cancer Paternal Aunt     Social History   Socioeconomic History   Marital status: Married    Spouse name: Not on file   Number of children: Not on file   Years of education: Not on file   Highest education level: Not on file  Occupational History   Not on file  Tobacco Use   Smoking status: Former   Smokeless tobacco: Never  Vaping Use   Vaping status: Never Used  Substance and Sexual  Activity   Alcohol use: Yes    Comment: rarely   Drug use: Never   Sexual activity: Yes    Birth control/protection: Inserts  Other Topics Concern   Not on file  Social History Narrative   Not on file   Social Drivers of Health   Tobacco Use: Medium Risk (02/14/2024)   Patient History    Smoking Tobacco Use: Former    Smokeless Tobacco Use: Never    Passive Exposure: Not on file  Financial Resource Strain: Low Risk  (11/03/2023)   Received from [facility]   Overall Financial Resource Strain (CARDIA)    Difficulty of Paying Living Expenses: Not hard at all  Food Insecurity: No Food Insecurity (11/03/2023)   Received from [facility]   Epic    Within the past 12 months, you worried that your food would run out before you got the money to buy more.: Never true    Within the past 12 months, the food you bought just didn't last and you didn't have money to get more.: Never true  Transportation Needs: No Transportation Needs (11/03/2023)   Received from [facility] - Transportation    In the past 12 months, has lack of transportation  kept you from medical appointments or from getting medications?: No    Lack of Transportation (Non-Medical): No  Physical Activity: Not on file  Stress: Not on file  Social Connections: Not on file  Intimate Partner Violence: Not on file  Depression (EYV7-0): Not on file  Alcohol Screen: Not on file  Housing: Low Risk  (11/03/2023)   Received from [facility]   Epic    In the last 12 months, was there a time when you were not able to pay the mortgage or rent on time?: No    In the past 12 months, how many times have you moved where you were living?: 0    At any time in the past 12 months, were you homeless or living in a shelter (including now)?: No  Utilities: Not At Risk (11/03/2023)   [facility] from [facility]   Epic    In the past 12 months has the electric, gas, oil, or water company threatened to shut off services in your home?: No  Health Literacy: Not on file    Past Medical History, Surgical history, Social history, and Family history were reviewed and updated as appropriate.   Please see review of systems for further details on the patient's review from today.   Objective:   Physical Exam:  There were no vitals taken for this visit.  Physical Exam Neurological:     Mental Status: She is alert and oriented to person, place, and time.     Cranial Nerves: No dysarthria.  Psychiatric:        Attention and Perception: Attention and perception normal.        Mood and Affect: Mood normal. Mood is not anxious.        Speech: Speech normal. Speech is not slurred.        Behavior: Behavior is not slowed. Behavior is cooperative.        Thought Content: Thought content normal. Thought content is not paranoid or delusional. Thought content does not include homicidal or suicidal ideation. Thought content does not include suicidal plan.        Cognition and Memory: Cognition and memory normal.  Judgment: Judgment normal.     Comments:  Insight intact     Lab Review:  No results found for: NA, K, CL, CO2, GLUCOSE, BUN, CREATININE, CALCIUM, PROT, ALBUMIN, AST, ALT, ALKPHOS, BILITOT, GFRNONAA, GFRAA  No results found for: WBC, RBC, HGB, HCT, PLT, MCV, MCH, MCHC, RDW, LYMPHSABS, MONOABS, EOSABS, BASOSABS  No results found for: POCLITH, LITHIUM   No results found for: PHENYTOIN, PHENOBARB, VALPROATE, CBMZ   .res Assessment: Plan:    Atypical depression - Plan: buPROPion  (WELLBUTRIN  XL) 300 MG 24 hr tablet  Attention deficit hyperactivity disorder (ADHD), predominantly inattentive type - Plan: VYVANSE  70 MG capsule  Insomnia due to mental condition   Good response to medications.  She has been stable for some times.  Depression and ADD are under control with the medications.  She does not want medicine changes. Got more irritable without Wellbutrin  so restarted it.  Migraines are manageable without severity anymore.  Migraines are less severe.  No med changes today again.  Disc Vyvanse  transition to generic likely after the new year.   Was a cautioned about the risk of Ambien  causing amnesia.  She is aware and using the lowest effective dose.  Ambien  helped.   Option Ambien  CR.  Supportive therapy dealing with working from home and not letting herself develop phobia of driving. Disc using standing desk bc long hours at desk.    Discussed potential benefits, risks, and side effects of stimulants with patient to include increased heart rate, palpitations, insomnia, increased anxiety, increased irritability, or decreased appetite.  Instructed patient to contact office if experiencing any significant tolerability issues.  Continue Wellbutrin  XL 300 mg AM, vyvanse  70 mg and Ambien  5-10 mg HS  Follow-up 9-12 mos bc stable a long time  Lorene Macintosh, MD, DFAPA  Please see After Visit Summary for patient specific instructions.  No future  appointments.  No orders of the defined types were placed in this encounter.      -------------------------------
